# Patient Record
Sex: Female | Born: 1935 | Race: White | Hispanic: No | Marital: Married | Smoking: Never smoker
Health system: Southern US, Community
[De-identification: ages and names within clinical notes are randomized; demographics above are authoritative.]

## PROBLEM LIST (undated history)

## (undated) DIAGNOSIS — M1990 Unspecified osteoarthritis, unspecified site: Secondary | ICD-10-CM

## (undated) DIAGNOSIS — I639 Cerebral infarction, unspecified: Secondary | ICD-10-CM

## (undated) DIAGNOSIS — I2699 Other pulmonary embolism without acute cor pulmonale: Secondary | ICD-10-CM

## (undated) DIAGNOSIS — C4490 Unspecified malignant neoplasm of skin, unspecified: Secondary | ICD-10-CM

## (undated) DIAGNOSIS — M797 Fibromyalgia: Secondary | ICD-10-CM

## (undated) DIAGNOSIS — G8929 Other chronic pain: Secondary | ICD-10-CM

## (undated) DIAGNOSIS — M549 Dorsalgia, unspecified: Secondary | ICD-10-CM

## (undated) HISTORY — PX: WRIST FRACTURE SURGERY: SHX121

## (undated) HISTORY — PX: ABDOMINAL HYSTERECTOMY: SHX81

## (undated) HISTORY — PX: INSERTION OF MESH: SHX5868

## (undated) HISTORY — PX: CHOLECYSTECTOMY: SHX55

## (undated) HISTORY — PX: CATARACT EXTRACTION, BILATERAL: SHX1313

## (undated) HISTORY — PX: HAND SURGERY: SHX662

## (undated) HISTORY — PX: FOOT SURGERY: SHX648

---

## 1998-03-07 ENCOUNTER — Ambulatory Visit (HOSPITAL_COMMUNITY): Admission: RE | Admit: 1998-03-07 | Discharge: 1998-03-07 | Payer: Self-pay | Admitting: Cardiology

## 1998-07-02 ENCOUNTER — Ambulatory Visit (HOSPITAL_COMMUNITY): Admission: RE | Admit: 1998-07-02 | Discharge: 1998-07-02 | Payer: Self-pay | Admitting: *Deleted

## 1999-04-03 ENCOUNTER — Ambulatory Visit (HOSPITAL_COMMUNITY): Admission: RE | Admit: 1999-04-03 | Discharge: 1999-04-03 | Payer: Self-pay | Admitting: *Deleted

## 1999-04-03 ENCOUNTER — Encounter (INDEPENDENT_AMBULATORY_CARE_PROVIDER_SITE_OTHER): Payer: Self-pay | Admitting: Specialist

## 1999-12-08 ENCOUNTER — Ambulatory Visit (HOSPITAL_COMMUNITY): Admission: RE | Admit: 1999-12-08 | Discharge: 1999-12-08 | Payer: Self-pay | Admitting: *Deleted

## 2001-05-17 ENCOUNTER — Encounter (INDEPENDENT_AMBULATORY_CARE_PROVIDER_SITE_OTHER): Payer: Self-pay | Admitting: *Deleted

## 2001-05-17 ENCOUNTER — Ambulatory Visit (HOSPITAL_COMMUNITY): Admission: RE | Admit: 2001-05-17 | Discharge: 2001-05-17 | Payer: Self-pay | Admitting: *Deleted

## 2003-01-30 ENCOUNTER — Encounter: Payer: Self-pay | Admitting: Family Medicine

## 2003-01-30 ENCOUNTER — Encounter: Admission: RE | Admit: 2003-01-30 | Discharge: 2003-01-30 | Payer: Self-pay | Admitting: Family Medicine

## 2009-11-08 ENCOUNTER — Emergency Department (HOSPITAL_BASED_OUTPATIENT_CLINIC_OR_DEPARTMENT_OTHER): Admission: EM | Admit: 2009-11-08 | Discharge: 2009-11-08 | Payer: Self-pay | Admitting: Emergency Medicine

## 2009-11-08 ENCOUNTER — Ambulatory Visit: Payer: Self-pay | Admitting: Radiology

## 2011-02-06 NOTE — Procedures (Signed)
[place]. [facility]  Patient:    Brittney Hicks, Brittney Hicks Visit Number: [id number] MRN: [id number]          Service Type: END Location: ENDO Attending Physician:  Sabino Gasser Proc. Date: 05/17/01 Adm. Date:  05/17/2001                             Procedure Report  PROCEDURE PERFORMED:  Colonoscopy.  ENDOSCOPIST:  Sabino Gasser, M.D.  INDICATIONS FOR PROCEDURE:  Hemoccult positivity.  Colon cancer screening. Polyps by history.  ANESTHESIA:  Fentanyl an additional 40 and Versed 4 mg.  DESCRIPTION OF PROCEDURE:  With the patient mildly sedated in the left lateral decubitus position, the Olympus videoscopic pediatric colonoscope was inserted in the rectum and passed under direct vision into the cecum.  The cecum was identified by the ileocecal valve and appendiceal orifice, both of which were photographed.  From this point, the colonoscope was slowly withdrawn, taking circumferential views of the entire colonic mucosa stopping only then in the rectum which appeared normal on direct view and showed hemorrhoids on retroflex view.  The endoscope was straightened and withdrawn.  Patients vital signs and pulse oximeter remained stable.  The patient tolerated the procedure well and without apparent complications.  FINDINGS:  Internal hemorrhoids, otherwise unremarkable colonoscopic examination to the cecum.  PLAN:  See endoscopy note for further details of follow-up plan. Attending Physician:  Sabino Gasser DD:  05/17/01 TD:  05/17/01 Job: [id number]

## 2011-02-06 NOTE — Procedures (Signed)
[place]. [facility]  Patient:    Brittney Hicks, CRAGIN Visit Number: [id number] MRN: [id number]          Service Type: Attending:  Sabino Gasser, M.D. Proc. Date: 05/17/01                             Procedure Report  PROCEDURE PERFORMED:  Upper endoscopy with biopsy.  ENDOSCOPIST:  Sabino Gasser, M.D.  INDICATIONS FOR PROCEDURE:  Abdominal pain.  ANESTHESIA:  Fentanyl 90 mcg, Versed 9 mg.  DESCRIPTION OF PROCEDURE:  With the patient mildly sedated in the left lateral decubitus position, the Olympus video endoscope was inserted in the mouth and passed under direct vision through the esophagus which appeared normal into the stomach.  The fundus was viewed and appeared normal with normal folds. However, the body was markedly inflamed and eroded, ulcerated and very friable-appearing mucosa that was limited to the body.  This was photographed and multiple biopsies were taken.  We advanced to the antrum which appeared relatively normal as did the duodenal bulb and second portion of the duodenum.  From this point, the endoscope was slowly withdrawn taking circumferential views of the entire duodenal mucosa until the endoscope had been pulled back into the stomach at which point we once again photographed the body and biopsied it and placed the endoscope on retroflexion to view the stomach from below and this was photographed.  The endoscope was then straightened and withdrawn taking circumferential views of the remaining gastric mucosa and esophageal mucosa, all of which otherwise appeared normal.  Patients vital signs and pulse oximeter remained stable.  The patient tolerated the procedure well without apparent complications.  FINDINGS:  Markedly inflamed area in the body of the stomach, await biopsy report.  The patient will call for results and follow up with me as an outpatient.  Proceed with colonoscopy as planned. Attending:  Sabino Gasser, M.D. DD:  05/17/01 TD:   05/17/01 Job: [id number]

## 2014-07-31 ENCOUNTER — Emergency Department (HOSPITAL_BASED_OUTPATIENT_CLINIC_OR_DEPARTMENT_OTHER)
Admission: EM | Admit: 2014-07-31 | Discharge: 2014-07-31 | Disposition: A | Discharge status: Home / Self Care | Payer: Medicare Other | Attending: Emergency Medicine | Admitting: Emergency Medicine

## 2014-07-31 ENCOUNTER — Encounter (HOSPITAL_BASED_OUTPATIENT_CLINIC_OR_DEPARTMENT_OTHER): Payer: Self-pay | Admitting: Emergency Medicine

## 2014-07-31 ENCOUNTER — Emergency Department (HOSPITAL_BASED_OUTPATIENT_CLINIC_OR_DEPARTMENT_OTHER): Payer: Medicare Other

## 2014-07-31 DIAGNOSIS — Z86711 Personal history of pulmonary embolism: Secondary | ICD-10-CM | POA: Diagnosis not present

## 2014-07-31 DIAGNOSIS — Y9389 Activity, other specified: Secondary | ICD-10-CM | POA: Diagnosis not present

## 2014-07-31 DIAGNOSIS — N390 Urinary tract infection, site not specified: Secondary | ICD-10-CM | POA: Insufficient documentation

## 2014-07-31 DIAGNOSIS — G8929 Other chronic pain: Secondary | ICD-10-CM | POA: Insufficient documentation

## 2014-07-31 DIAGNOSIS — S3992XA Unspecified injury of lower back, initial encounter: Secondary | ICD-10-CM | POA: Diagnosis present

## 2014-07-31 DIAGNOSIS — Y990 Civilian activity done for income or pay: Secondary | ICD-10-CM | POA: Insufficient documentation

## 2014-07-31 DIAGNOSIS — S32030A Wedge compression fracture of third lumbar vertebra, initial encounter for closed fracture: Secondary | ICD-10-CM | POA: Insufficient documentation

## 2014-07-31 DIAGNOSIS — Y92009 Unspecified place in unspecified non-institutional (private) residence as the place of occurrence of the external cause: Secondary | ICD-10-CM | POA: Insufficient documentation

## 2014-07-31 DIAGNOSIS — Z8739 Personal history of other diseases of the musculoskeletal system and connective tissue: Secondary | ICD-10-CM | POA: Diagnosis not present

## 2014-07-31 DIAGNOSIS — W01198A Fall on same level from slipping, tripping and stumbling with subsequent striking against other object, initial encounter: Secondary | ICD-10-CM | POA: Insufficient documentation

## 2014-07-31 DIAGNOSIS — W19XXXA Unspecified fall, initial encounter: Secondary | ICD-10-CM

## 2014-07-31 HISTORY — DX: Other pulmonary embolism without acute cor pulmonale: I26.99

## 2014-07-31 HISTORY — DX: Unspecified osteoarthritis, unspecified site: M19.90

## 2014-07-31 HISTORY — DX: Dorsalgia, unspecified: M54.9

## 2014-07-31 HISTORY — DX: Other chronic pain: G89.29

## 2014-07-31 LAB — URINE MICROSCOPIC-ADD ON

## 2014-07-31 LAB — URINALYSIS, ROUTINE W REFLEX MICROSCOPIC
Bilirubin Urine: NEGATIVE
Glucose, UA: NEGATIVE mg/dL
Hgb urine dipstick: NEGATIVE
Ketones, ur: NEGATIVE mg/dL
NITRITE: POSITIVE — AB
Protein, ur: NEGATIVE mg/dL
SPECIFIC GRAVITY, URINE: 1.018 (ref 1.005–1.030)
Urobilinogen, UA: 0.2 mg/dL (ref 0.0–1.0)
pH: 5.5 (ref 5.0–8.0)

## 2014-07-31 MED ORDER — DEXTROSE 5 % IV SOLN: 1.0000 g | Freq: Once | INTRAVENOUS | Status: DC

## 2014-07-31 MED ORDER — CEPHALEXIN 500 MG PO CAPS: 500.0000 mg | ORAL_CAPSULE | Freq: Four times a day (QID) | ORAL | Status: AC

## 2014-07-31 MED ORDER — HYDROCODONE-ACETAMINOPHEN 5-325 MG PO TABS
2.0000 | ORAL_TABLET | Freq: Once | ORAL | Status: DC
  Filled 2014-07-31: qty 2

## 2014-07-31 MED ORDER — CEFTRIAXONE SODIUM 1 G IJ SOLR
1.0000 g | Freq: Once | INTRAMUSCULAR | Status: AC
  Administered 2014-07-31: 1 g via INTRAMUSCULAR
  Filled 2014-07-31: qty 10

## 2014-07-31 MED ORDER — HYDROCODONE-ACETAMINOPHEN 5-325 MG PO TABS
2.0000 | ORAL_TABLET | Freq: Once | ORAL | Status: AC
  Administered 2014-07-31: 2 via ORAL

## 2014-07-31 MED ORDER — LIDOCAINE HCL (PF) 1 % IJ SOLN
INTRAMUSCULAR | Status: AC
  Administered 2014-07-31: 2 mL
  Filled 2014-07-31: qty 5

## 2014-07-31 NOTE — ED Notes (Signed)
Patient transported to X-ray 

## 2014-07-31 NOTE — ED Provider Notes (Signed)
CSN: [id number]     Arrival date & time 07/31/14  1038 History   First MD Initiated Contact with Patient 07/31/14 1049     Chief Complaint  Patient presents with  . Fall     (Consider location/radiation/quality/duration/timing/severity/associated sxs/prior Treatment) Patient is a 80 y.o. female presenting with fall. The history is provided by the patient.  Fall This is a new problem. The current episode started 1 to 2 hours ago. Episode frequency: once. The problem has been resolved. Pertinent negatives include no chest pain, no abdominal pain, no headaches and no shortness of breath. Exacerbated by: standing. Nothing relieves the symptoms. She has tried nothing for the symptoms. The treatment provided no relief.    Past Medical History  Diagnosis Date  . Chronic pain   . Osteoarthritis   . Chronic back pain greater than 3 months duration   . PE (pulmonary embolism)    No past surgical history on file. No family history on file. History  Substance Use Topics  . Smoking status: Never Smoker   . Smokeless tobacco: Not on file  . Alcohol Use: Not on file   OB History    No data available     Review of Systems  Constitutional: Negative for fever and fatigue.  HENT: Negative for congestion and drooling.   Eyes: Negative for pain.  Respiratory: Negative for cough and shortness of breath.   Cardiovascular: Negative for chest pain.  Gastrointestinal: Negative for nausea, vomiting, abdominal pain and diarrhea.  Genitourinary: Negative for dysuria and hematuria.  Musculoskeletal: Negative for back pain, gait problem and neck pain.  Skin: Negative for color change.  Neurological: Negative for dizziness and headaches.  Hematological: Negative for adenopathy.  Psychiatric/Behavioral: Negative for behavioral problems.  All other systems reviewed and are negative.     Allergies  Lovenox; Stadol; and Vistaril  Home Medications   Prior to Admission medications   Not on File    BP 123/87 mmHg  Pulse 90  Temp(Src) 98.1 F (36.7 C)  Resp 18  SpO2 97% Physical Exam  Constitutional: She is oriented to person, place, and time. She appears well-developed and well-nourished.  HENT:  Head: Normocephalic and atraumatic.  Mouth/Throat: Oropharynx is clear and moist. No oropharyngeal exudate.  No obvious trauma to the scalp.  Eyes: Conjunctivae and EOM are normal. Pupils are equal, round, and reactive to light.  Neck: Normal range of motion. Neck supple.  Cardiovascular: Normal rate, regular rhythm, normal heart sounds and intact distal pulses.  Exam reveals no gallop and no friction rub.   No murmur heard. Pulmonary/Chest: Effort normal and breath sounds normal. No respiratory distress. She has no wheezes.  Abdominal: Soft. Bowel sounds are normal. There is no tenderness. There is no rebound and no guarding.  Musculoskeletal: Normal range of motion. She exhibits no edema or tenderness.  Old appearing superficial burn to the dorsum of the right hand at the base of the thumb.  Mild abrasion to the base of the left palm. Mild tenderness to palpation in this area. Normal range of motion of the left wrist.  Mild nonspecific tenderness to the lower lumbar spine. No other vertebral tenderness noted.  Neurological: She is alert and oriented to person, place, and time.  alert, oriented x3 speech: normal in context and clarity memory: intact grossly cranial nerves II-XII: intact motor strength: full proximally and distally no involuntary movements or tremors sensation: intact to light touch diffusely  cerebellar: finger-to-nose  gait: normal gait  Skin: Skin  is warm and dry.  Psychiatric: She has a normal mood and affect. Her behavior is normal.  Nursing note and vitals reviewed.   ED Course  Procedures (including critical care time) Labs Review Labs Reviewed  URINALYSIS, ROUTINE W REFLEX MICROSCOPIC - Abnormal; Notable for the following:    Nitrite POSITIVE (*)     Leukocytes, UA TRACE (*)    All other components within normal limits  URINE MICROSCOPIC-ADD ON - Abnormal; Notable for the following:    Squamous Epithelial / LPF FEW (*)    Bacteria, UA MANY (*)    All other components within normal limits  URINE CULTURE    Imaging Review Dg Lumbar Spine Complete  07/31/2014   CLINICAL DATA:  Status post fall this morning now with low back pain  EXAM: LUMBAR SPINE - COMPLETE 4+ VIEW  COMPARISON:  Coronal and sagittal reconstructed images from a CT scan of the abdomen and pelvis of October 10, 2011  FINDINGS: The lumbar vertebral bodies are diffusely osteopenic. There is mild compression of the body of L1 with loss of height of 10% or left anteriorly. This is new since 2013. There is mild narrowing of the intervertebral disc spaces especially at L4-5 and L5-S1. There is facet joint hypertrophy at the same 2 levels. There is no spondylolisthesis. The pedicles and transverse processes are intact where visualized. The observed portions of the sacrum are unremarkable. There is an inferior vena caval filter present to the right of midline at L3. There are surgical clips in the gallbladder fossa.  IMPRESSION: There is very mild partial compression of the body of L3 new since 2013. There are degenerative disc and facet joint changes in the lower lumbar spine.   Electronically Signed   By: David  Martinique   On: 07/31/2014 12:40   Dg Wrist Complete Left  07/31/2014   CLINICAL DATA:  Status post fall this morning now with left hand and wrist pain  EXAM: LEFT WRIST - COMPLETE 3+ VIEW  COMPARISON:  None.  FINDINGS: The bones of the wrist are osteopenic. There are degenerative intercarpal joint changes. There is mild narrowing of the radiocarpal joint. The metallic hardware in the distal radius from previous fracture is intact. The soft tissues are unremarkable.  IMPRESSION: There is no acute bony abnormality of the left wrist. There is moderate degenerative change.    Electronically Signed   By: David  Martinique   On: 07/31/2014 12:37   Dg Hand Complete Left  07/31/2014   CLINICAL DATA:  Status post fall this morning with persistent left hand and wrist pain  EXAM: LEFT HAND - COMPLETE 3+ VIEW  COMPARISON:  Left thumb series of November 12, 2008  FINDINGS: The bones of the hand are osteopenic. The interphalangeal and metacarpophalangeal joints are mildly narrowed not out of proportion to the patient's age. No acute phalangeal or metacarpal fracture is demonstrated. The soft tissues are unremarkable. There are mild degenerative changes of the intercarpal joints. The patient has undergone previous ORIF for a previous radial fracture.  IMPRESSION: There is no acute bony abnormality of the left hand or wrist. If the patient has symptoms referable to the distal forearm, plain films of the radius and ulna would be useful.   Electronically Signed   By: David  Martinique   On: 07/31/2014 12:36     EKG Interpretation   Date/Time:  Tuesday July 31 2014 11:11:01 EST Ventricular Rate:  77 PR Interval:  166 QRS Duration: 76 QT Interval:  382  QTC Calculation: 432 R Axis:   25 Text Interpretation:  Normal sinus rhythm Confirmed by Lorriane Dehart  MD,  Derren Suydam ([id number]) on 07/31/2014 1:24:41 PM      MDM   Final diagnoses:  Fall  Compression fracture of L3 lumbar vertebra, closed, initial encounter  UTI (lower urinary tract infection)    11:02 AM 80 y.o. female the history of PE not on blood thinners who presents with a mechanical fall which occurred prior to arrival. The family notes that the patient usually has some staggering when she gets up from a seated position. The patient endorses that this is what happened today and she fell backwards onto a hard floor hitting her buttocks. She then fell back hitting her head against the wall. She denies any loss of consciousness. She denies any headache. She currently complains of low back pain and left hand pain. We'll give her pain  control and get screening imaging.  UA c/w UTI. Will give 1g Rocephin here, home on keflex. Lumbar spine shows mild partial comp fx of L3 new since 2013. Pain controlled, pt is ambulatory w/out difficulty. Plan for d/c home.   1:56 PM:  I have discussed the diagnosis/risks/treatment options with the patient and family and believe the pt to be eligible for discharge home to follow-up with her pcp. The pt has pain medicine at home. We also discussed returning to the ED immediately if new or worsening sx occur. We discussed the sx which are most concerning (e.g., worsening pain, fever, further falls) that necessitate immediate return. Medications administered to the patient during their visit and any new prescriptions provided to the patient are listed below.  Medications given during this visit Medications  HYDROcodone-acetaminophen (NORCO/VICODIN) 5-325 MG per tablet 2 tablet (2 tablets Oral Given 07/31/14 1105)  cefTRIAXone (ROCEPHIN) injection 1 g (1 g Intramuscular Given 07/31/14 1354)  lidocaine (PF) (XYLOCAINE) 1 % injection (2 mLs  Given 07/31/14 1354)    Discharge Medication List as of 07/31/2014  1:58 PM    START taking these medications   Details  cephALEXin (KEFLEX) 500 MG capsule Take 1 capsule (500 mg total) by mouth 4 (four) times daily., Starting 07/31/2014, Until Tue 08/07/14, Print         Pamella Pert, MD 07/31/14 2128

## 2014-07-31 NOTE — ED Notes (Signed)
MD at bedside. 

## 2014-07-31 NOTE — ED Notes (Signed)
Per EMS:  Pt at home, pt has balance issues.  Pt refuses to use cane.   Pt stumbled and fell onto buttocks and back.    No spinal cord pain.  Pt having pain to right wrist.  Good CMS.

## 2014-08-02 LAB — URINE CULTURE: SPECIAL REQUESTS: NORMAL

## 2014-08-03 ENCOUNTER — Telehealth (HOSPITAL_BASED_OUTPATIENT_CLINIC_OR_DEPARTMENT_OTHER): Payer: Self-pay | Admitting: Emergency Medicine

## 2014-08-03 NOTE — Telephone Encounter (Signed)
Post ED Visit - Positive Culture Follow-up  Culture report reviewed by antimicrobial stewardship pharmacist: []  Wes Woodford, Pharm.D., BCPS [x]  Heide Guile, Pharm.D., BCPS []  Alycia Rossetti, Pharm.D., BCPS []  Crystal, [place].D., BCPS, AAHIVP []  Legrand Como, Pharm.D., BCPS, AAHIVP []  Elicia Lamp, Pharm.D.   Positive urine culture Treated with cephalexin, organism sensitive to the same and no further patient follow-up is required at this time.  Hazle Nordmann 08/03/2014, 4:44 PM

## 2017-04-12 ENCOUNTER — Emergency Department (HOSPITAL_BASED_OUTPATIENT_CLINIC_OR_DEPARTMENT_OTHER): Payer: Medicare Other

## 2017-04-12 ENCOUNTER — Emergency Department (HOSPITAL_BASED_OUTPATIENT_CLINIC_OR_DEPARTMENT_OTHER)
Admission: EM | Admit: 2017-04-12 | Discharge: 2017-04-12 | Disposition: A | Discharge status: Home / Self Care | Payer: Medicare Other | Attending: Emergency Medicine | Admitting: Emergency Medicine

## 2017-04-12 ENCOUNTER — Encounter (HOSPITAL_BASED_OUTPATIENT_CLINIC_OR_DEPARTMENT_OTHER): Payer: Self-pay | Admitting: *Deleted

## 2017-04-12 DIAGNOSIS — Y939 Activity, unspecified: Secondary | ICD-10-CM | POA: Insufficient documentation

## 2017-04-12 DIAGNOSIS — M5410 Radiculopathy, site unspecified: Secondary | ICD-10-CM

## 2017-04-12 DIAGNOSIS — M5414 Radiculopathy, thoracic region: Secondary | ICD-10-CM | POA: Insufficient documentation

## 2017-04-12 DIAGNOSIS — W1789XA Other fall from one level to another, initial encounter: Secondary | ICD-10-CM | POA: Diagnosis not present

## 2017-04-12 DIAGNOSIS — M7062 Trochanteric bursitis, left hip: Secondary | ICD-10-CM | POA: Insufficient documentation

## 2017-04-12 DIAGNOSIS — Z85828 Personal history of other malignant neoplasm of skin: Secondary | ICD-10-CM | POA: Diagnosis not present

## 2017-04-12 DIAGNOSIS — M545 Low back pain: Secondary | ICD-10-CM | POA: Diagnosis present

## 2017-04-12 DIAGNOSIS — Y999 Unspecified external cause status: Secondary | ICD-10-CM | POA: Diagnosis not present

## 2017-04-12 DIAGNOSIS — Z79899 Other long term (current) drug therapy: Secondary | ICD-10-CM | POA: Diagnosis not present

## 2017-04-12 HISTORY — DX: Cerebral infarction, unspecified: I63.9

## 2017-04-12 HISTORY — DX: Fibromyalgia: M79.7

## 2017-04-12 HISTORY — DX: Unspecified malignant neoplasm of skin, unspecified: C44.90

## 2017-04-12 NOTE — ED Triage Notes (Signed)
Pt reports falling on Saturday. She was standing by a chair that had a lot of heavy fabric draped across the back and it slid off and she fell with it. C/o pain in left lower back and hip. Had a cortisone injection in left hip earlier this month for bursitis. Pt reports difficulty getting up and down but is able to ambulate using her home walker. Denies LOC. Also reports bruise to left arm

## 2017-04-12 NOTE — ED Provider Notes (Signed)
[place] DEPT [facility] Provider Note   CSN: [id number] Arrival date & time: 04/12/17  0820     History   Chief Complaint Chief Complaint  Patient presents with  . Back Pain    HPI Brittney Hicks is a 83 y.o. female.  HPI   83 year old female presents with concern for left lower buttock pain and pain to the lateral hip. She had similar pain earlier this month, received an injection for trochanteric bursitis and had improvement. She then had a fall on Saturday, where she leaned on a chair with a blanket which filled and she fell onto her left side on the wood floor. Denies hitting her head, loss of consciousness, neck pain, numbness, weakness, recent infection symptoms. Reports she's been in her normal state of health. Reports that she has taken hydrocodone for prior back and hip pain, however has not taken it since this injury. Patient denies use of blood thinners. Reports pain is worse when she first goes to get up, however is improved when she continues to walk with her walker.  Past Medical History:  Diagnosis Date  . Chronic back pain greater than 3 months duration   . Chronic pain   . Fibromyalgia   . Osteoarthritis   . PE (pulmonary embolism)   . Skin cancer   . Stroke [facility])     There are no active problems to display for this patient.   Past Surgical History:  Procedure Laterality Date  . ABDOMINAL HYSTERECTOMY    . CATARACT EXTRACTION, BILATERAL    . CHOLECYSTECTOMY    . FOOT SURGERY    . HAND SURGERY    . INSERTION OF MESH    . WRIST FRACTURE SURGERY      OB History    No data available       Home Medications    Prior to Admission medications   Medication Sig Start Date End Date Taking? Authorizing Provider  cetirizine (ZYRTEC) 10 MG tablet Take 10 mg by mouth daily.   Yes [provider]  clonazePAM (KLONOPIN) 1 MG tablet Take 1.5 mg by mouth daily.    Yes [provider]  Cyanocobalamin (VITAMIN B-12 IJ) Inject 1,000 mcg as  directed every 30 (thirty) days.   Yes [provider]  dexlansoprazole (DEXILANT) 60 MG capsule Take 60 mg by mouth daily.   Yes [provider]  HYDROcodone-acetaminophen (NORCO) 7.5-325 MG tablet Take 1 tablet by mouth every 6 (six) hours as needed for moderate pain.   Yes [provider]  metoprolol tartrate (LOPRESSOR) 25 MG tablet Take 12.5 mg by mouth 2 (two) times daily.   Yes [provider]  mirtazapine (REMERON) 15 MG tablet Take 45 mg by mouth at bedtime.    Yes [provider]  ranitidine (ZANTAC) 150 MG tablet Take 150 mg by mouth daily.   Yes [provider]  rOPINIRole (REQUIP) 2 MG tablet Take 7 mg by mouth at bedtime.    Yes [provider]  temazepam (RESTORIL) 15 MG capsule Take 15 mg by mouth at bedtime as needed for sleep.   Yes [provider]  trimethoprim (TRIMPEX) 100 MG tablet Take 100 mg by mouth 2 (two) times daily.   Yes [provider]  valACYclovir (VALTREX) 1000 MG tablet Take 1,000 mg by mouth every other day.   Yes [provider]  sertraline (ZOLOFT) 100 MG tablet Take 100 mg by mouth daily.    [provider]  Family History No family history on file.  Social History Social History  Substance Use Topics  . Smoking status: Never Smoker  . Smokeless tobacco: Never Used  . Alcohol use No     Allergies   Benzocaine; Lovenox [enoxaparin sodium]; Stadol [butorphanol]; Tape; Vistaril [hydroxyzine hcl]; Warfarin and related; Demerol [meperidine]; Dilaudid [hydromorphone hcl]; and Macrobid [nitrofurantoin]   Review of Systems Review of Systems  Constitutional: Negative for fever.  HENT: Negative for sore throat.   Eyes: Negative for visual disturbance.  Respiratory: Negative for cough and shortness of breath.   Cardiovascular: Negative for chest pain.  Gastrointestinal: Negative for abdominal pain.  Genitourinary: Negative for difficulty urinating.    Musculoskeletal: Positive for arthralgias and back pain. Negative for neck pain.  Skin: Negative for rash.  Neurological: Negative for syncope, weakness, numbness and headaches.     Physical Exam Updated Vital Signs BP 124/73 (BP Location: Right Arm)   Pulse 82   Temp 98 F (36.7 C) (Oral)   Resp 20   SpO2 97%   Physical Exam  Constitutional: She is oriented to person, place, and time. She appears well-developed and well-nourished. No distress.  HENT:  Head: Normocephalic and atraumatic.  Eyes: Conjunctivae and EOM are normal.  Neck: Normal range of motion.  Cardiovascular: Normal rate, regular rhythm, normal heart sounds and intact distal pulses.  Exam reveals no gallop and no friction rub.   No murmur heard. Pulmonary/Chest: Effort normal and breath sounds normal. No respiratory distress. She has no wheezes. She has no rales.  Abdominal: Soft. She exhibits no distension. There is no tenderness. There is no guarding.  Musculoskeletal: She exhibits no edema or tenderness.  Tenderness over sciatic notch, left trochanteric bursa Full ROM of hip, knee, ankle Tenderness left paraspinal muscles lumbar area No cervical, thoracic, or midline lumbar tenderness  Neurological: She is alert and oriented to person, place, and time. She has normal strength. No sensory deficit.  Skin: Skin is warm and dry. No rash noted. She is not diaphoretic. No erythema.  Nursing note and vitals reviewed.    ED Treatments / Results  Labs (all labs ordered are listed, but only abnormal results are displayed) Labs Reviewed - No data to display  EKG  EKG Interpretation None       Radiology Dg Lumbar Spine Complete  Result Date: 04/12/2017 CLINICAL DATA:  [place] 2 days ago with low back pain. EXAM: LUMBAR SPINE - COMPLETE 4+ VIEW COMPARISON:  01/29/2017 FINDINGS: Bones are diffusely demineralized. Surgical clips right upper quadrant suggest prior cholecystectomy. IVC filter visualized in situ.  Multiple old right rib fractures are evident. Compression fracture at L1 is stable. Loss of intervertebral disc height at L4-5 and L5-S1, unchanged. Bones are diffusely demineralized. IMPRESSION: 1. Stable exam. L1 compression deformity unchanged in the interval. No acute lumbar spine fracture evident. 2. Old posterior right rib fractures. 3. Lower lumbar degenerative changes. Electronically Signed   By: Misty Stanley M.D.   On: 04/12/2017 09:07   Dg Hip Unilat W Or Wo Pelvis 2-3 Views Left  Result Date: 04/12/2017 CLINICAL DATA:  Left hip and low back pain since falling 2 days ago. EXAM: DG HIP (WITH OR WITHOUT PELVIS) 2-3V LEFT COMPARISON:  Pelvic CT 10/27/2014. FINDINGS: The bones appear mildly demineralized. There is no evidence of acute fracture or dislocation. There is no evidence of femoral head avascular necrosis. The hip and sacroiliac joint spaces are maintained. There is lower lumbar spondylosis, an IVC filter and a surgical clip  overlying the left iliac bone. IMPRESSION: No evidence of acute fracture or dislocation. If the patient has persistent hip pain or inability to bear weight, follow up imaging may be warranted as acute hip fractures can be radiographically occult in the elderly. Electronically Signed   By: Richardean Sale M.D.   On: 04/12/2017 09:09    Procedures Procedures (including critical care time)  Medications Ordered in ED Medications - No data to display   Initial Impression / Assessment and Plan / ED Course  I have reviewed the triage vital signs and the nursing notes.  Pertinent labs & imaging results that were available during my care of the patient were reviewed by me and considered in my medical decision making (see chart for details).     83 year old female presents with concern for fall with left hip and lower back pain. X-rays show no sign of acute fracture. Patient is able to bear weight and ambulate without significant pain, and have illicit suspicion for  occult fracture. No lumbar midline pain. She is neurovascularly intact. Denies any other injuries from fall or recent illness. Patient had history of similar symptoms prior, and feel file probably exacerbated her trochanteric bursitis as well as radicular back pain. Recommended continued supportive care and follow-up with her orthopedic physician.  Final Clinical Impressions(s) / ED Diagnoses   Final diagnoses:  Trochanteric bursitis of left hip  Radicular low back pain    New Prescriptions New Prescriptions   No medications on file     Gareth Morgan, MD 04/12/17 [phone]

## 2019-10-08 ENCOUNTER — Encounter (HOSPITAL_COMMUNITY): Payer: Self-pay | Admitting: Family Medicine

## 2019-10-08 ENCOUNTER — Emergency Department (HOSPITAL_COMMUNITY): Payer: Medicare PPO

## 2019-10-08 ENCOUNTER — Other Ambulatory Visit: Payer: Self-pay

## 2019-10-08 ENCOUNTER — Inpatient Hospital Stay (HOSPITAL_COMMUNITY)
Admission: EM | Admit: 2019-10-08 | Discharge: 2019-10-14 | DRG: 178 | Disposition: A | Discharge status: Home / Self Care | Payer: Medicare PPO | Attending: Internal Medicine | Admitting: Internal Medicine

## 2019-10-08 ENCOUNTER — Observation Stay (HOSPITAL_BASED_OUTPATIENT_CLINIC_OR_DEPARTMENT_OTHER): Payer: Medicare PPO

## 2019-10-08 DIAGNOSIS — U071 COVID-19: Secondary | ICD-10-CM

## 2019-10-08 DIAGNOSIS — K2100 Gastro-esophageal reflux disease with esophagitis, without bleeding: Secondary | ICD-10-CM | POA: Diagnosis present

## 2019-10-08 DIAGNOSIS — Z79899 Other long term (current) drug therapy: Secondary | ICD-10-CM

## 2019-10-08 DIAGNOSIS — K449 Diaphragmatic hernia without obstruction or gangrene: Secondary | ICD-10-CM | POA: Diagnosis present

## 2019-10-08 DIAGNOSIS — I10 Essential (primary) hypertension: Secondary | ICD-10-CM | POA: Diagnosis present

## 2019-10-08 DIAGNOSIS — R7989 Other specified abnormal findings of blood chemistry: Secondary | ICD-10-CM

## 2019-10-08 DIAGNOSIS — Z9049 Acquired absence of other specified parts of digestive tract: Secondary | ICD-10-CM

## 2019-10-08 DIAGNOSIS — F419 Anxiety disorder, unspecified: Secondary | ICD-10-CM

## 2019-10-08 DIAGNOSIS — N179 Acute kidney failure, unspecified: Secondary | ICD-10-CM | POA: Diagnosis present

## 2019-10-08 DIAGNOSIS — F329 Major depressive disorder, single episode, unspecified: Secondary | ICD-10-CM | POA: Diagnosis present

## 2019-10-08 DIAGNOSIS — Z66 Do not resuscitate: Secondary | ICD-10-CM | POA: Diagnosis present

## 2019-10-08 DIAGNOSIS — G8929 Other chronic pain: Secondary | ICD-10-CM | POA: Diagnosis present

## 2019-10-08 DIAGNOSIS — K5900 Constipation, unspecified: Secondary | ICD-10-CM | POA: Diagnosis present

## 2019-10-08 DIAGNOSIS — N183 Chronic kidney disease, stage 3 unspecified: Secondary | ICD-10-CM | POA: Diagnosis present

## 2019-10-08 DIAGNOSIS — Z885 Allergy status to narcotic agent status: Secondary | ICD-10-CM

## 2019-10-08 DIAGNOSIS — R531 Weakness: Secondary | ICD-10-CM

## 2019-10-08 DIAGNOSIS — Z8673 Personal history of transient ischemic attack (TIA), and cerebral infarction without residual deficits: Secondary | ICD-10-CM

## 2019-10-08 DIAGNOSIS — D539 Nutritional anemia, unspecified: Secondary | ICD-10-CM | POA: Diagnosis present

## 2019-10-08 DIAGNOSIS — Z95828 Presence of other vascular implants and grafts: Secondary | ICD-10-CM

## 2019-10-08 DIAGNOSIS — I129 Hypertensive chronic kidney disease with stage 1 through stage 4 chronic kidney disease, or unspecified chronic kidney disease: Secondary | ICD-10-CM | POA: Diagnosis present

## 2019-10-08 DIAGNOSIS — N1832 Chronic kidney disease, stage 3b: Secondary | ICD-10-CM | POA: Diagnosis not present

## 2019-10-08 DIAGNOSIS — Z85828 Personal history of other malignant neoplasm of skin: Secondary | ICD-10-CM

## 2019-10-08 DIAGNOSIS — Z888 Allergy status to other drugs, medicaments and biological substances status: Secondary | ICD-10-CM

## 2019-10-08 DIAGNOSIS — Z90710 Acquired absence of both cervix and uterus: Secondary | ICD-10-CM

## 2019-10-08 DIAGNOSIS — E876 Hypokalemia: Secondary | ICD-10-CM | POA: Diagnosis not present

## 2019-10-08 DIAGNOSIS — E860 Dehydration: Secondary | ICD-10-CM

## 2019-10-08 DIAGNOSIS — N390 Urinary tract infection, site not specified: Secondary | ICD-10-CM

## 2019-10-08 DIAGNOSIS — Z86711 Personal history of pulmonary embolism: Secondary | ICD-10-CM

## 2019-10-08 DIAGNOSIS — Z87440 Personal history of urinary (tract) infections: Secondary | ICD-10-CM

## 2019-10-08 DIAGNOSIS — R5383 Other fatigue: Secondary | ICD-10-CM | POA: Diagnosis present

## 2019-10-08 DIAGNOSIS — M797 Fibromyalgia: Secondary | ICD-10-CM | POA: Diagnosis present

## 2019-10-08 LAB — CBC WITH DIFFERENTIAL/PLATELET
Abs Immature Granulocytes: 0.02 10*3/uL (ref 0.00–0.07)
Basophils Absolute: 0 10*3/uL (ref 0.0–0.1)
Basophils Relative: 0 %
Eosinophils Absolute: 0 10*3/uL (ref 0.0–0.5)
Eosinophils Relative: 0 %
HCT: 32.2 % — ABNORMAL LOW (ref 36.0–46.0)
Hemoglobin: 10.5 g/dL — ABNORMAL LOW (ref 12.0–15.0)
Immature Granulocytes: 1 %
Lymphocytes Relative: 23 %
Lymphs Abs: 0.9 10*3/uL (ref 0.7–4.0)
MCH: 34.4 pg — ABNORMAL HIGH (ref 26.0–34.0)
MCHC: 32.6 g/dL (ref 30.0–36.0)
MCV: 105.6 fL — ABNORMAL HIGH (ref 80.0–100.0)
Monocytes Absolute: 0.3 10*3/uL (ref 0.1–1.0)
Monocytes Relative: 7 %
Neutro Abs: 2.6 10*3/uL (ref 1.7–7.7)
Neutrophils Relative %: 69 %
Platelets: 180 10*3/uL (ref 150–400)
RBC: 3.05 MIL/uL — ABNORMAL LOW (ref 3.87–5.11)
RDW: 12.6 % (ref 11.5–15.5)
WBC: 3.7 10*3/uL — ABNORMAL LOW (ref 4.0–10.5)
nRBC: 0 % (ref 0.0–0.2)

## 2019-10-08 LAB — URINALYSIS, ROUTINE W REFLEX MICROSCOPIC
Bacteria, UA: NONE SEEN
Bilirubin Urine: NEGATIVE
Glucose, UA: NEGATIVE mg/dL
Hgb urine dipstick: NEGATIVE
Ketones, ur: NEGATIVE mg/dL
Leukocytes,Ua: NEGATIVE
Nitrite: NEGATIVE
Protein, ur: 30 mg/dL — AB
Specific Gravity, Urine: 1.016 (ref 1.005–1.030)
pH: 5 (ref 5.0–8.0)

## 2019-10-08 LAB — LACTATE DEHYDROGENASE: LDH: 205 U/L — ABNORMAL HIGH (ref 98–192)

## 2019-10-08 LAB — CK: Total CK: 184 U/L (ref 38–234)

## 2019-10-08 LAB — COMPREHENSIVE METABOLIC PANEL
ALT: 19 U/L (ref 0–44)
AST: 24 U/L (ref 15–41)
Albumin: 3.3 g/dL — ABNORMAL LOW (ref 3.5–5.0)
Alkaline Phosphatase: 44 U/L (ref 38–126)
Anion gap: 11 (ref 5–15)
BUN: 35 mg/dL — ABNORMAL HIGH (ref 8–23)
CO2: 19 mmol/L — ABNORMAL LOW (ref 22–32)
Calcium: 8.3 mg/dL — ABNORMAL LOW (ref 8.9–10.3)
Chloride: 108 mmol/L (ref 98–111)
Creatinine, Ser: 1.6 mg/dL — ABNORMAL HIGH (ref 0.44–1.00)
GFR calc Af Amer: 33 mL/min — ABNORMAL LOW (ref >60)
GFR calc non Af Amer: 28 mL/min — ABNORMAL LOW (ref >60)
Glucose, Bld: 119 mg/dL — ABNORMAL HIGH (ref 70–99)
Potassium: 3.8 mmol/L (ref 3.5–5.1)
Sodium: 138 mmol/L (ref 135–145)
Total Bilirubin: 0.3 mg/dL (ref 0.3–1.2)
Total Protein: 6.5 g/dL (ref 6.5–8.1)

## 2019-10-08 LAB — TSH: TSH: 0.595 u[IU]/mL (ref 0.350–4.500)

## 2019-10-08 LAB — ABO/RH: ABO/RH(D): O POS

## 2019-10-08 LAB — FIBRINOGEN: Fibrinogen: 597 mg/dL — ABNORMAL HIGH (ref 210–475)

## 2019-10-08 LAB — C-REACTIVE PROTEIN: CRP: 10.6 mg/dL — ABNORMAL HIGH (ref <1.0)

## 2019-10-08 LAB — PROCALCITONIN: Procalcitonin: 0.11 ng/mL

## 2019-10-08 LAB — LACTIC ACID, PLASMA: Lactic Acid, Venous: 0.6 mmol/L (ref 0.5–1.9)

## 2019-10-08 LAB — FERRITIN: Ferritin: 298 ng/mL (ref 11–307)

## 2019-10-08 LAB — SARS CORONAVIRUS 2 (TAT 6-24 HRS): SARS Coronavirus 2: POSITIVE — AB

## 2019-10-08 LAB — VITAMIN B12: Vitamin B-12: 1146 pg/mL — ABNORMAL HIGH (ref 180–914)

## 2019-10-08 LAB — D-DIMER, QUANTITATIVE: D-Dimer, Quant: 2.14 ug/mL-FEU — ABNORMAL HIGH (ref 0.00–0.50)

## 2019-10-08 LAB — TRIGLYCERIDES: Triglycerides: 268 mg/dL — ABNORMAL HIGH (ref <150)

## 2019-10-08 MED ORDER — CEPHALEXIN 500 MG PO CAPS
500.0000 mg | ORAL_CAPSULE | Freq: Two times a day (BID) | ORAL | Status: DC
  Administered 2019-10-08 – 2019-10-11 (×7): 500 mg via ORAL
  Filled 2019-10-08 (×7): qty 1

## 2019-10-08 MED ORDER — SODIUM CHLORIDE 0.9% FLUSH
3.0000 mL | Freq: Two times a day (BID) | INTRAVENOUS | Status: DC
  Administered 2019-10-08 – 2019-10-14 (×10): 3 mL via INTRAVENOUS

## 2019-10-08 MED ORDER — PANTOPRAZOLE SODIUM 40 MG PO TBEC
40.0000 mg | DELAYED_RELEASE_TABLET | Freq: Two times a day (BID) | ORAL | Status: DC
  Administered 2019-10-08 – 2019-10-11 (×8): 40 mg via ORAL
  Filled 2019-10-08 (×9): qty 1

## 2019-10-08 MED ORDER — ONDANSETRON HCL 4 MG PO TABS
4.0000 mg | ORAL_TABLET | Freq: Four times a day (QID) | ORAL | Status: DC | PRN
  Administered 2019-10-10: 4 mg via ORAL
  Filled 2019-10-08: qty 1

## 2019-10-08 MED ORDER — ACETAMINOPHEN 325 MG PO TABS
650.0000 mg | ORAL_TABLET | Freq: Four times a day (QID) | ORAL | Status: DC | PRN
  Administered 2019-10-08 – 2019-10-14 (×8): 650 mg via ORAL
  Filled 2019-10-08 (×8): qty 2

## 2019-10-08 MED ORDER — SENNOSIDES-DOCUSATE SODIUM 8.6-50 MG PO TABS: 1.0000 | ORAL_TABLET | Freq: Every evening | ORAL | Status: DC | PRN

## 2019-10-08 MED ORDER — ORAL CARE MOUTH RINSE
15.0000 mL | Freq: Two times a day (BID) | OROMUCOSAL | Status: DC
  Administered 2019-10-09 – 2019-10-13 (×9): 15 mL via OROMUCOSAL

## 2019-10-08 MED ORDER — ONDANSETRON HCL 4 MG/2ML IJ SOLN
4.0000 mg | Freq: Four times a day (QID) | INTRAMUSCULAR | Status: DC | PRN
  Administered 2019-10-09: 19:00:00 4 mg via INTRAVENOUS
  Filled 2019-10-08: qty 2

## 2019-10-08 MED ORDER — SODIUM CHLORIDE 0.9 % IV BOLUS
500.0000 mL | Freq: Once | INTRAVENOUS | Status: AC
  Administered 2019-10-08: 01:00:00 500 mL via INTRAVENOUS

## 2019-10-08 MED ORDER — GUAIFENESIN-DM 100-10 MG/5ML PO SYRP
10.0000 mL | ORAL_SOLUTION | ORAL | Status: DC | PRN
  Administered 2019-10-08 – 2019-10-10 (×4): 10 mL via ORAL
  Filled 2019-10-08 (×4): qty 10

## 2019-10-08 MED ORDER — HEPARIN SODIUM (PORCINE) 5000 UNIT/ML IJ SOLN
5000.0000 [IU] | Freq: Three times a day (TID) | INTRAMUSCULAR | Status: DC
  Administered 2019-10-08 – 2019-10-14 (×18): 5000 [IU] via SUBCUTANEOUS
  Filled 2019-10-08 (×19): qty 1

## 2019-10-08 MED ORDER — SODIUM CHLORIDE 0.9 % IV SOLN
1000.0000 mL | INTRAVENOUS | Status: DC
  Administered 2019-10-08: 01:00:00 1000 mL via INTRAVENOUS

## 2019-10-08 MED ORDER — SODIUM CHLORIDE 0.9 % IV SOLN
1000.0000 mL | INTRAVENOUS | Status: AC
  Administered 2019-10-08: 04:00:00 1000 mL via INTRAVENOUS

## 2019-10-08 MED ORDER — ACETAMINOPHEN 650 MG RE SUPP: 650.0000 mg | Freq: Four times a day (QID) | RECTAL | Status: DC | PRN

## 2019-10-08 NOTE — ED Provider Notes (Signed)
[facility] DEPT Provider Note   CSN: [id number] Arrival date & time: 10/08/19  0016   Time seen 12:33 AM  History Chief Complaint  Patient presents with  . COVID+  . Urinary Tract Infection   Level 5 caveat for altered mental status  [facility] is a 86 y.o. female.  HPI    Patient presents via EMS.  When I first asked her what is going on tonight she states "I do not know".  She then states she is weak, she has dyspnea on exertion and mild chest pain and she points to her Epigastric area.  She denies nausea, vomiting, or diarrhea.  She has a cough and is producing sputum.  She complains of sore throat and rhinorrhea.  Family told EMS that patient is normally independent and able to walk without assisted but now she is so weak she is unable to walk without assistance or to feed herself.  According to family she is being treated for a UTI.  PCP [name], MD   Past Medical History:  Diagnosis Date  . Chronic back pain greater than 3 months duration   . Chronic pain   . Fibromyalgia   . Osteoarthritis   . PE (pulmonary embolism)   . Skin cancer   . Stroke [facility])     There are no problems to display for this patient.   Past Surgical History:  Procedure Laterality Date  . ABDOMINAL HYSTERECTOMY    . CATARACT EXTRACTION, BILATERAL    . CHOLECYSTECTOMY    . FOOT SURGERY    . HAND SURGERY    . INSERTION OF MESH    . WRIST FRACTURE SURGERY       OB History   No obstetric history on file.     No family history on file.  Social History   Tobacco Use  . Smoking status: Never Smoker  . Smokeless tobacco: Never Used  Substance Use Topics  . Alcohol use: No  . Drug use: No  living with daughter  Home Medications Prior to Admission medications   Medication Sig Start Date End Date Taking? Authorizing Provider  cephALEXin (KEFLEX) 500 MG capsule Take 500 mg by mouth 2 (two) times daily. 10/06/19  Yes [provider]   cetirizine (ZYRTEC) 10 MG tablet Take 10 mg by mouth daily.   Yes [provider]  clonazePAM (KLONOPIN) 1 MG tablet Take 1.5 mg by mouth daily.    Yes [provider]  Cyanocobalamin (VITAMIN B-12 IJ) Inject 1,000 mcg as directed every 30 (thirty) days.   Yes [provider]  metoprolol succinate (TOPROL-XL) 25 MG 24 hr tablet Take 0.5 tablets by mouth daily. 08/01/19  Yes [provider]  mirtazapine (REMERON) 45 MG tablet Take 45 mg by mouth at bedtime. 08/01/19  Yes [provider]  pantoprazole (PROTONIX) 40 MG tablet Take 1 tablet by mouth 2 (two) times daily. 08/01/19  Yes [provider]  rOPINIRole (REQUIP) 2 MG tablet Take 7 mg by mouth at bedtime.    Yes [provider]  sertraline (ZOLOFT) 100 MG tablet Take 100 mg by mouth daily.   Yes [provider]  trimethoprim (TRIMPEX) 100 MG tablet Take 100 mg by mouth 2 (two) times daily.   Yes [provider]    Allergies    Benzocaine, Lovenox [enoxaparin sodium], Stadol [butorphanol], Tape, Vistaril [hydroxyzine hcl], Warfarin and related, Demerol [meperidine], Dilaudid [hydromorphone hcl], and Macrobid [nitrofurantoin]  Review of  Systems   Review of Systems  Unable to perform ROS: Age    Physical Exam Updated Vital Signs BP (!) 114/52   Pulse 71   Temp 99.9 F (37.7 C) (Oral)   Resp (!) 23   SpO2 93%   Physical Exam Vitals and nursing note reviewed.  Constitutional:      Appearance: Normal appearance.  HENT:     Head: Normocephalic and atraumatic.     Right Ear: External ear normal.     Left Ear: External ear normal.     Mouth/Throat:     Mouth: Mucous membranes are dry.  Eyes:     Extraocular Movements: Extraocular movements intact.     Conjunctiva/sclera: Conjunctivae normal.     Pupils: Pupils are equal, round, and reactive to light.  Cardiovascular:     Rate and Rhythm: Normal rate and regular rhythm.     Pulses: Normal pulses.      Heart sounds: Normal heart sounds.  Pulmonary:     Effort: Pulmonary effort is normal. No respiratory distress.     Breath sounds: Normal breath sounds. No wheezing or rales.  Abdominal:     General: Abdomen is flat. Bowel sounds are normal.     Palpations: Abdomen is soft.     Tenderness: There is no abdominal tenderness.  Musculoskeletal:        General: Normal range of motion.     Cervical back: Normal range of motion.     Right lower leg: No edema.     Left lower leg: No edema.  Skin:    General: Skin is warm and dry.  Neurological:     General: No focal deficit present.     Mental Status: She is alert.     Cranial Nerves: No cranial nerve deficit.  Psychiatric:        Mood and Affect: Affect is flat.        Speech: Speech is delayed.        Behavior: Behavior is slowed.     ED Results / Procedures / Treatments   Labs Results for orders placed or performed during the hospital encounter of 10/08/19  Lactic acid, plasma  Result Value Ref Range   Lactic Acid, Venous 0.6 0.5 - 1.9 mmol/L  CBC WITH DIFFERENTIAL  Result Value Ref Range   WBC 3.7 (L) 4.0 - 10.5 K/uL   RBC 3.05 (L) 3.87 - 5.11 MIL/uL   Hemoglobin 10.5 (L) 12.0 - 15.0 g/dL   HCT 32.2 (L) 36.0 - 46.0 %   MCV 105.6 (H) 80.0 - 100.0 fL   MCH 34.4 (H) 26.0 - 34.0 pg   MCHC 32.6 30.0 - 36.0 g/dL   RDW 12.6 11.5 - 15.5 %   Platelets 180 150 - 400 K/uL   nRBC 0.0 0.0 - 0.2 %   Neutrophils Relative % 69 %   Neutro Abs 2.6 1.7 - 7.7 K/uL   Lymphocytes Relative 23 %   Lymphs Abs 0.9 0.7 - 4.0 K/uL   Monocytes Relative 7 %   Monocytes Absolute 0.3 0.1 - 1.0 K/uL   Eosinophils Relative 0 %   Eosinophils Absolute 0.0 0.0 - 0.5 K/uL   Basophils Relative 0 %   Basophils Absolute 0.0 0.0 - 0.1 K/uL   Immature Granulocytes 1 %   Abs Immature Granulocytes 0.02 0.00 - 0.07 K/uL  Comprehensive metabolic panel  Result Value Ref Range   Sodium 138 135 - 145 mmol/L   Potassium 3.8 3.5 -  5.1 mmol/L   Chloride 108 98  - 111 mmol/L   CO2 19 (L) 22 - 32 mmol/L   Glucose, Bld 119 (H) 70 - 99 mg/dL   BUN 35 (H) 8 - 23 mg/dL   Creatinine, Ser 1.60 (H) 0.44 - 1.00 mg/dL   Calcium 8.3 (L) 8.9 - 10.3 mg/dL   Total Protein 6.5 6.5 - 8.1 g/dL   Albumin 3.3 (L) 3.5 - 5.0 g/dL   AST 24 15 - 41 U/L   ALT 19 0 - 44 U/L   Alkaline Phosphatase 44 38 - 126 U/L   Total Bilirubin 0.3 0.3 - 1.2 mg/dL   GFR calc non Af Amer 28 (L) >60 mL/min   GFR calc Af Amer 33 (L) >60 mL/min   Anion gap 11 5 - 15  D-dimer, quantitative  Result Value Ref Range   D-Dimer, Quant 2.14 (H) 0.00 - 0.50 ug/mL-FEU  Procalcitonin  Result Value Ref Range   Procalcitonin 0.11 ng/mL  Lactate dehydrogenase  Result Value Ref Range   LDH 205 (H) 98 - 192 U/L  Ferritin  Result Value Ref Range   Ferritin 298 11 - 307 ng/mL  Triglycerides  Result Value Ref Range   Triglycerides 268 (H) <150 mg/dL  Fibrinogen  Result Value Ref Range   Fibrinogen 597 (H) 210 - 475 mg/dL  C-reactive protein  Result Value Ref Range   CRP 10.6 (H) <1.0 mg/dL  Urinalysis, Routine w reflex microscopic  Result Value Ref Range   Color, Urine YELLOW YELLOW   APPearance CLEAR CLEAR   Specific Gravity, Urine 1.016 1.005 - 1.030   pH 5.0 5.0 - 8.0   Glucose, UA NEGATIVE NEGATIVE mg/dL   Hgb urine dipstick NEGATIVE NEGATIVE   Bilirubin Urine NEGATIVE NEGATIVE   Ketones, ur NEGATIVE NEGATIVE mg/dL   Protein, ur 30 (A) NEGATIVE mg/dL   Nitrite NEGATIVE NEGATIVE   Leukocytes,Ua NEGATIVE NEGATIVE   RBC / HPF 0-5 0 - 5 RBC/hpf   WBC, UA 0-5 0 - 5 WBC/hpf   Bacteria, UA NONE SEEN NONE SEEN   Mucus PRESENT     Laboratory interpretation all normal except elevated inflammatory markers consistent with her diagnosis of Covid, low total white blood cell count without neutropenia, mild anemia, renal insufficiency without prior to compare      (all labs ordered are listed, but only abnormal results are displayed) Results for orders placed or performed during the  hospital encounter of 07/31/14  Urine culture   Specimen: Urine, Catheterized  Result Value Ref Range   Specimen Description URINE, CATHETERIZED    Special Requests Normal    Culture  Setup Time      07/31/2014 22:12 Performed at [place]      >=100,000 COLONIES/ML Performed at [place] Performed at [facility]    Report Status 08/02/2014 FINAL    Organism ID, Bacteria ESCHERICHIA COLI       Susceptibility   Escherichia coli - MIC*    AMPICILLIN 4 SENSITIVE Sensitive     CEFAZOLIN <=4 SENSITIVE Sensitive     CEFTRIAXONE <=1 SENSITIVE Sensitive     CIPROFLOXACIN <=0.25 SENSITIVE Sensitive     GENTAMICIN <=1 SENSITIVE Sensitive     LEVOFLOXACIN <=0.12 SENSITIVE Sensitive     NITROFURANTOIN <=16 SENSITIVE Sensitive     TOBRAMYCIN <=1 SENSITIVE Sensitive     TRIMETH/SULFA <=20 SENSITIVE Sensitive  PIP/TAZO <=4 SENSITIVE Sensitive     * ESCHERICHIA COLI  Urinalysis, Routine w reflex microscopic  Result Value Ref Range   Color, Urine YELLOW YELLOW   APPearance CLEAR CLEAR   Specific Gravity, Urine 1.018 1.005 - 1.030   pH 5.5 5.0 - 8.0   Glucose, UA NEGATIVE NEGATIVE mg/dL   Hgb urine dipstick NEGATIVE NEGATIVE   Bilirubin Urine NEGATIVE NEGATIVE   Ketones, ur NEGATIVE NEGATIVE mg/dL   Protein, ur NEGATIVE NEGATIVE mg/dL   Urobilinogen, UA 0.2 0.0 - 1.0 mg/dL   Nitrite POSITIVE (A) NEGATIVE   Leukocytes, UA TRACE (A) NEGATIVE  Urine microscopic-add on  Result Value Ref Range   Squamous Epithelial / LPF FEW (A) RARE   WBC, UA 3-6 <3 WBC/hpf   RBC / HPF 0-2 <3 RBC/hpf   Bacteria, UA MANY (A) RARE      EKG EKG Interpretation  Date/Time:  Sunday October 08 2019 00:26:43 EST Ventricular Rate:  81 PR Interval:    QRS Duration: 88 QT Interval:  376 QTC Calculation: 437 R Axis:   -11 Text Interpretation: Sinus rhythm Low voltage, precordial leads No significant change since last  tracing 31 Jul 2014 Confirmed by Rolland Porter [phone]) on 10/08/2019 2:04:40 AM   Radiology DG Chest Port 1 View  Result Date: 10/08/2019 CLINICAL DATA:  Shortness of breath. COVID positive. EXAM: PORTABLE CHEST 1 VIEW COMPARISON:  Radiograph 01/17/2018, CT 10/27/2014 FINDINGS: Heart is normal in size. Retrocardiac hiatal hernia. No focal airspace disease. No pleural fluid, pulmonary edema or pneumothorax. Bones are diffusely under mineralized. IMPRESSION: No evidence of pneumonia.  Hiatal hernia. Electronically Signed   By: Keith Rake M.D.   On: 10/08/2019 01:24    Procedures Procedures (including critical care time)  Medications Ordered in ED Medications  0.9 %  sodium chloride infusion (1,000 mLs Intravenous New Bag/Given 10/08/19 0119)  sodium chloride 0.9 % bolus 500 mL (0 mLs Intravenous Stopped 10/08/19 0216)    ED Course  I have reviewed the triage vital signs and the nursing notes.  Pertinent labs & imaging results that were available during my care of the patient were reviewed by me and considered in my medical decision making (see chart for details).  Pulse ox 92% on RA    MDM Rules/Calculators/A&P                     Patient appeared to be dehydrated, she was given IV fluids.  Laboratory testing was done.  Patient is D-dimer is moderately elevated however her other inflammatory markers are high.  I am unable to do CTA of her chest because of her creatinine clearance.  VQ scan can be done later today if the admitting doctors want it done.  Patient reportedly was positive for Covid however it is not in our system.  A  Covid test was done.  Due to her weakness and inability to walk without assistance I thought patient should be admitted.  She has not required oxygen although her pulse ox has been mildly low at 92%.  Her chest x-ray does not show any obvious infiltrates.  Her urinary tract infection has been treated appropriately with trimethoprim based on her culture from  January 10.  3:21 AM Dr. Myna Hidalgo, hospitalist will admit  Leamber Fichter Withers was evaluated in Emergency Department on 10/08/2019 for the symptoms described in the history of present illness. She was evaluated in the context of the global COVID-19 pandemic, which necessitated consideration that the patient  might be at risk for infection with the SARS-CoV-2 virus that causes COVID-19. Institutional protocols and algorithms that pertain to the evaluation of patients at risk for COVID-19 are in a state of rapid change based on information released by regulatory bodies including the CDC and federal and state organizations. These policies and algorithms were followed during the patient's care in the ED.   Final Clinical Impression(s) / ED Diagnoses Final diagnoses:  COVID-19  Weakness  Dehydration  Lower urinary tract infectious disease    Rx / DC Orders Plan admission  Rolland Porter, MD, Barbette Or, MD 10/08/19 [phone]

## 2019-10-08 NOTE — ED Notes (Signed)
Patient called out that she had to have a BM, assisted to bedside commode. Patient was continent with diarrhea and urine. Assisted back into the bed and new pure-wick applied. Linens and bed changed for fresh sheets.

## 2019-10-08 NOTE — Progress Notes (Signed)
Chart reviewed.  Patient seen and examined.  Awake, alert, oriented x3.  Updated patient's daughter via telephone.  Noted she is currently not on remdesivir, dexamethasone.  Currently on room air with no chest x-ray changes but does have elevated CRP of 10.  Low threshold to start remdesivir/dexamethasone in case of a new oxygen requirement or worsening/persistent tachypnea.  Budd Palmer, MD Internal Medicine  Hospitalist

## 2019-10-08 NOTE — ED Notes (Addendum)
Second attempt to call report unsuccessful will await call back.

## 2019-10-08 NOTE — ED Notes (Signed)
Attempted to call report to receiving nurse, unavailable. Will try again in few minutes

## 2019-10-08 NOTE — ED Notes (Signed)
ED TO INPATIENT HANDOFF REPORT  ED Nurse Name and Phone #: Lorre Nick RN   S Name/Age/Gender Brittney Hicks 86 y.o. female Room/Bed: WA20/WA20  Code Status   Code Status: DNR  Home/SNF/Other Home Patient oriented to: self, place, time and situation Is this baseline? Yes   Triage Complete: Triage complete  Chief Complaint COVID-19 virus infection [U07.1]  Triage Note Pt presents to ED via [facility] coming from home. Family called out for pt due to increased weakness since being diagnosed with COVID on Friday. Pt is normally independent and able to walk unassisted and CA&Ox4 but is so weak now she needs assistance to eat and walk. Pt has also been being treated for a UTI and is currently taken antibiotics. EMS reports pt VS: 118/66, 92% on RA, 98% 2 LPM, Rhonchi in all fields. Daughter on scene reports that pt is a DNR but pt was staying with family at form was not at that residence.  Daughter: [name] [phone]    Allergies Allergies  Allergen Reactions  . Benzocaine Other (See Comments)    Oral blisters  . Lovenox [Enoxaparin Sodium] Other (See Comments)    Has received this med without issue since it was listed as an allergy  . Stadol [Butorphanol]     Had a stroke after receiving  . Tape Other (See Comments)    blisters  . Vistaril [Hydroxyzine Hcl]     Had a stroke after receiving  . Warfarin And Related Other (See Comments)    Hematoma while on this and lovenox  . Demerol [Meperidine] Rash  . Dilaudid [Hydromorphone Hcl] Itching and Rash  . Macrobid [Nitrofurantoin] Rash    Level of Care/Admitting Diagnosis ED Disposition    ED Disposition Condition Comment   Admit  Hospital Area: [place] [id number]  Level of Care: Telemetry [5]  Admit to tele based on following criteria: Monitor for Ischemic changes  Covid Evaluation: Confirmed COVID Positive  Diagnosis: COVID-19 virus infection [id number]  Admitting Physician: Vianne Bulls  [id number]  Attending Physician: Vianne Bulls [id number]       B Medical/Surgery History Past Medical History:  Diagnosis Date  . Chronic back pain greater than 3 months duration   . Chronic pain   . Fibromyalgia   . Osteoarthritis   . PE (pulmonary embolism)   . Skin cancer   . Stroke [facility])    Past Surgical History:  Procedure Laterality Date  . ABDOMINAL HYSTERECTOMY    . CATARACT EXTRACTION, BILATERAL    . CHOLECYSTECTOMY    . FOOT SURGERY    . HAND SURGERY    . INSERTION OF MESH    . WRIST FRACTURE SURGERY       A IV Location/Drains/Wounds Patient Lines/Drains/Airways Status   Active Line/Drains/Airways    Name:   Placement date:   Placement time:   Site:   Days:   Peripheral IV 10/08/19 Left Antecubital   10/08/19    0119    Antecubital   less than 1   External Urinary Catheter   10/08/19    0029    --   less than 1          Intake/Output Last 24 hours  Intake/Output Summary (Last 24 hours) at 10/08/2019 2118 Last data filed at 10/08/2019 1248 Gross per 24 hour  Intake 1709.58 ml  Output --  Net 1709.58 ml    Labs/Imaging Results for orders placed or performed during the hospital encounter of 10/08/19 (from  the past 48 hour(s))  SARS CORONAVIRUS 2 (TAT 6-24 HRS) Nasopharyngeal Nasopharyngeal Swab     Status: Abnormal   Collection Time: 10/08/19 12:40 AM   Specimen: Nasopharyngeal Swab  Result Value Ref Range   SARS Coronavirus 2 POSITIVE (A) NEGATIVE    Comment: RESULT CALLED TO, READ BACK BY AND VERIFIED WITH: [place] GARRISON RN.@[email or web address]  ON 1.17.21 BY TCALDWELL MT. (NOTE) SARS-CoV-2 target nucleic acids are DETECTED. The SARS-CoV-2 RNA is generally detectable in upper and lower respiratory specimens during the acute phase of infection. Positive results are indicative of the presence of SARS-CoV-2 RNA. Clinical correlation with patient history and other diagnostic information is  necessary to determine patient infection status. Positive results  do not rule out bacterial infection or co-infection with other viruses.  The expected result is Negative. Fact Sheet for Patients: SugarRoll.be Fact Sheet for Healthcare Providers: https://www.woods-mathews.com/ This test is not yet approved or cleared by the [place] FDA and  has been authorized for detection and/or diagnosis of SARS-CoV-2 by FDA under an Emergency Use Authorization (EUA). This EUA will remain  in effect (meaning this test ca n be used) for the duration of the COVID-19 declaration under Section 564(b)(1) of the Act, 21 U.S.C. section 360bbb-3(b)(1), unless the authorization is terminated or revoked sooner. Performed at [facility], [place] [address] [place], [place] [address]   Lactic acid, plasma     Status: None   Collection Time: 10/08/19 12:40 AM  Result Value Ref Range   Lactic Acid, Venous 0.6 0.5 - 1.9 mmol/L    Comment: Performed at [facility], [place] [address] [place], [place] [address]  CBC WITH DIFFERENTIAL     Status: Abnormal   Collection Time: 10/08/19 12:40 AM  Result Value Ref Range   WBC 3.7 (L) 4.0 - 10.5 K/uL   RBC 3.05 (L) 3.87 - 5.11 MIL/uL   Hemoglobin 10.5 (L) 12.0 - 15.0 g/dL   HCT 32.2 (L) 36.0 - 46.0 %   MCV 105.6 (H) 80.0 - 100.0 fL   MCH 34.4 (H) 26.0 - 34.0 pg   MCHC 32.6 30.0 - 36.0 g/dL   RDW 12.6 11.5 - 15.5 %   Platelets 180 150 - 400 K/uL   nRBC 0.0 0.0 - 0.2 %   Neutrophils Relative % 69 %   Neutro Abs 2.6 1.7 - 7.7 K/uL   Lymphocytes Relative 23 %   Lymphs Abs 0.9 0.7 - 4.0 K/uL   Monocytes Relative 7 %   Monocytes Absolute 0.3 0.1 - 1.0 K/uL   Eosinophils Relative 0 %   Eosinophils Absolute 0.0 0.0 - 0.5 K/uL   Basophils Relative 0 %   Basophils Absolute 0.0 0.0 - 0.1 K/uL   Immature Granulocytes 1 %   Abs Immature Granulocytes 0.02 0.00 - 0.07 K/uL    Comment: Performed at [facility], [place] [address] [place], [place]  [address]  Comprehensive metabolic panel     Status: Abnormal   Collection Time: 10/08/19 12:40 AM  Result Value Ref Range   Sodium 138 135 - 145 mmol/L   Potassium 3.8 3.5 - 5.1 mmol/L   Chloride 108 98 - 111 mmol/L   CO2 19 (L) 22 - 32 mmol/L   Glucose, Bld 119 (H) 70 - 99 mg/dL   BUN 35 (H) 8 - 23 mg/dL   Creatinine, Ser 1.60 (H) 0.44 - 1.00 mg/dL   Calcium 8.3 (L) 8.9 - 10.3 mg/dL   Total Protein 6.5 6.5 -  8.1 g/dL   Albumin 3.3 (L) 3.5 - 5.0 g/dL   AST 24 15 - 41 U/L   ALT 19 0 - 44 U/L   Alkaline Phosphatase 44 38 - 126 U/L   Total Bilirubin 0.3 0.3 - 1.2 mg/dL   GFR calc non Af Amer 28 (L) >60 mL/min   GFR calc Af Amer 33 (L) >60 mL/min   Anion gap 11 5 - 15    Comment: Performed at [facility], [place] [address] [place], [place] [address]  D-dimer, quantitative     Status: Abnormal   Collection Time: 10/08/19 12:40 AM  Result Value Ref Range   D-Dimer, Quant 2.14 (H) 0.00 - 0.50 ug/mL-FEU    Comment: (NOTE) At the manufacturer cut-off of 0.50 ug/mL FEU, this assay has been documented to exclude PE with a sensitivity and negative predictive value of 97 to 99%.  At this time, this assay has not been approved by the FDA to exclude DVT/VTE. Results should be correlated with clinical presentation. Performed at [facility], [place] [address], [place], [place] [address]   Procalcitonin     Status: None   Collection Time: 10/08/19 12:40 AM  Result Value Ref Range   Procalcitonin 0.11 ng/mL    Comment:        Interpretation: PCT (Procalcitonin) <= 0.5 ng/mL: Systemic infection (sepsis) is not likely. Local bacterial infection is possible. (NOTE)       Sepsis PCT Algorithm           Lower Respiratory Tract                                      Infection PCT Algorithm    ----------------------------     ----------------------------         PCT < 0.25 ng/mL                PCT < 0.10 ng/mL         Strongly encourage             Strongly  discourage   discontinuation of antibiotics    initiation of antibiotics    ----------------------------     -----------------------------       PCT 0.25 - 0.50 ng/mL            PCT 0.10 - 0.25 ng/mL               OR       >80% decrease in PCT            Discourage initiation of                                            antibiotics      Encourage discontinuation           of antibiotics    ----------------------------     -----------------------------         PCT >= 0.50 ng/mL              PCT 0.26 - 0.50 ng/mL               AND        <80% decrease in PCT             Encourage initiation  of                                             antibiotics       Encourage continuation           of antibiotics    ----------------------------     -----------------------------        PCT >= 0.50 ng/mL                  PCT > 0.50 ng/mL               AND         increase in PCT                  Strongly encourage                                      initiation of antibiotics    Strongly encourage escalation           of antibiotics                                     -----------------------------                                           PCT <= 0.25 ng/mL                                                 OR                                        > 80% decrease in PCT                                     Discontinue / Do not initiate                                             antibiotics Performed at [place] [address] [place] [address] [place], [place] [address]   Lactate dehydrogenase     Status: Abnormal   Collection Time: 10/08/19 12:40 AM  Result Value Ref Range   LDH 205 (H) 98 - 192 U/L    Comment: Performed at [facility], [place] [address] [place], [place] [address]  Ferritin     Status: None   Collection Time: 10/08/19 12:40 AM  Result Value Ref Range   Ferritin 298 11 - 307 ng/mL    Comment: Performed at [facility], [place] [address] [place], [place] [address]  Triglycerides     Status: Abnormal   Collection Time: 10/08/19 12:40 AM  Result Value Ref Range  Triglycerides 268 (H) <150 mg/dL    Comment: Performed at [facility], [place] [address] [place], [place] [address]  Fibrinogen     Status: Abnormal   Collection Time: 10/08/19 12:40 AM  Result Value Ref Range   Fibrinogen 597 (H) 210 - 475 mg/dL    Comment: Performed at [facility], [place] [address], [place], [place] [address]  C-reactive protein     Status: Abnormal   Collection Time: 10/08/19 12:40 AM  Result Value Ref Range   CRP 10.6 (H) <1.0 mg/dL    Comment: Performed at [facility], [place] [address] [place], [place] [address]  Urinalysis, Routine w reflex microscopic     Status: Abnormal   Collection Time: 10/08/19 12:42 AM  Result Value Ref Range   Color, Urine YELLOW YELLOW   APPearance CLEAR CLEAR   Specific Gravity, Urine 1.016 1.005 - 1.030   pH 5.0 5.0 - 8.0   Glucose, UA NEGATIVE NEGATIVE mg/dL   Hgb urine dipstick NEGATIVE NEGATIVE   Bilirubin Urine NEGATIVE NEGATIVE   Ketones, ur NEGATIVE NEGATIVE mg/dL   Protein, ur 30 (A) NEGATIVE mg/dL   Nitrite NEGATIVE NEGATIVE   Leukocytes,Ua NEGATIVE NEGATIVE   RBC / HPF 0-5 0 - 5 RBC/hpf   WBC, UA 0-5 0 - 5 WBC/hpf   Bacteria, UA NONE SEEN NONE SEEN   Mucus PRESENT     Comment: Performed at [facility], [place] [address] [place], [place] [address]  Blood Culture (routine x 2)     Status: None (Preliminary result)   Collection Time: 10/08/19  1:07 AM   Specimen: BLOOD  Result Value Ref Range   Specimen Description      BLOOD LEFT ANTECUBITAL Performed at [facility], [place] [address], [place], [place] [address]    Special Requests      BOTTLES DRAWN AEROBIC AND ANAEROBIC Blood Culture results may not be optimal due to an excessive volume of blood received in culture bottles Performed at [place] [address], [place], [place] [address]    Culture PENDING    Report Status PENDING   ABO/Rh     Status: None   Collection Time: 10/08/19  4:15 AM  Result Value Ref Range   ABO/RH(D)      O POS Performed at [facility], [place] [address] [place], [place] [address]   TSH     Status: None   Collection Time: 10/08/19  4:40 AM  Result Value Ref Range   TSH 0.595 0.350 - 4.500 uIU/mL    Comment: Performed by a 3rd Generation assay with a functional sensitivity of <=0.01 uIU/mL. Performed at [facility], [place] [address], [place], [place] [address]   CK     Status: None   Collection Time: 10/08/19  4:40 AM  Result Value Ref Range   Total CK 184 38 - 234 U/L    Comment: Performed at [facility], [place] [address], [place], [place] [address]  Vitamin B12     Status: Abnormal   Collection Time: 10/08/19  4:40 AM  Result Value Ref Range   Vitamin B-12 1,146 (H) 180 - 914 pg/mL    Comment: (NOTE) This assay is not validated for testing neonatal or myeloproliferative syndrome specimens for Vitamin B12 levels. Performed at [facility], [place] [address] [place], [place] [address]    DG Chest Keeler [address]  Result Date: 10/08/2019 CLINICAL DATA:  Shortness of breath. COVID positive. EXAM: PORTABLE CHEST 1 VIEW COMPARISON:  Radiograph 01/17/2018, CT 10/27/2014 FINDINGS: Heart is normal in size. Retrocardiac hiatal hernia. No focal airspace disease. No pleural fluid, pulmonary edema or pneumothorax. Bones are diffusely under mineralized. IMPRESSION: No evidence of pneumonia.  Hiatal hernia. Electronically Signed   By: Keith Rake M.D.   On: 10/08/2019 01:24   VAS [place] LOWER EXTREMITY VENOUS (DVT)  Result Date: 10/08/2019  Lower Venous Study Indications: Covid-19, elevated D-Dimer.  Comparison Study: No prior study Performing Technologist: Sharion Dove RVS  Examination Guidelines: A complete evaluation  includes B-mode imaging, spectral Doppler, color Doppler, and power Doppler as needed of all accessible portions of each vessel. Bilateral testing is considered an integral part of a complete examination. Limited examinations for reoccurring indications may be performed as noted.  +---------+---------------+---------+-----------+----------+--------------+ RIGHT    CompressibilityPhasicitySpontaneityPropertiesThrombus Aging +---------+---------------+---------+-----------+----------+--------------+ CFV      Full           Yes      Yes                                 +---------+---------------+---------+-----------+----------+--------------+ SFJ      Full                                                        +---------+---------------+---------+-----------+----------+--------------+ FV Prox  Full                                                        +---------+---------------+---------+-----------+----------+--------------+ FV Mid   Full                                                        +---------+---------------+---------+-----------+----------+--------------+ FV DistalFull                                                        +---------+---------------+---------+-----------+----------+--------------+ PFV      Full                                                        +---------+---------------+---------+-----------+----------+--------------+ POP      Full           Yes      Yes                                 +---------+---------------+---------+-----------+----------+--------------+ PTV      Full                                                        +---------+---------------+---------+-----------+----------+--------------+  PERO     Full                                                        +---------+---------------+---------+-----------+----------+--------------+    +---------+---------------+---------+-----------+----------+--------------+ LEFT     CompressibilityPhasicitySpontaneityPropertiesThrombus Aging +---------+---------------+---------+-----------+----------+--------------+ CFV      Full           Yes      Yes                                 +---------+---------------+---------+-----------+----------+--------------+ SFJ      Full                                                        +---------+---------------+---------+-----------+----------+--------------+ FV Prox  Full                                                        +---------+---------------+---------+-----------+----------+--------------+ FV Mid   Full                                                        +---------+---------------+---------+-----------+----------+--------------+ FV DistalFull                                                        +---------+---------------+---------+-----------+----------+--------------+ PFV      Full                                                        +---------+---------------+---------+-----------+----------+--------------+ POP      Full           Yes      Yes                                 +---------+---------------+---------+-----------+----------+--------------+ PTV      Full                                                        +---------+---------------+---------+-----------+----------+--------------+ PERO     Full                                                        +---------+---------------+---------+-----------+----------+--------------+  Summary: Right: There is no evidence of deep vein thrombosis in the lower extremity. Left: There is no evidence of deep vein thrombosis in the lower extremity.  *See table(s) above for measurements and observations. Electronically signed by Monica Martinez MD on 10/08/2019 at 3:57:24 PM.    Final     Pending Labs Unresulted Labs (From  admission, onward)    Start     Ordered   10/09/19 0500  CBC with Differential/Platelet  Daily,   R     10/08/19 0350   10/09/19 0500  Comprehensive metabolic panel  Daily,   R     10/08/19 0350   10/09/19 0500  C-reactive protein  Daily,   R     10/08/19 0350   10/09/19 0500  D-dimer, quantitative (not at [facility])  Daily,   R     10/08/19 0350   10/09/19 0500  Magnesium  Tomorrow morning,   R     10/08/19 0350   10/09/19 0500  Ferritin  Daily,   R     10/08/19 0350   10/08/19 0500  Folate RBC  Tomorrow morning,   R     10/08/19 0350   10/08/19 0042  Urine culture  ONCE - STAT,   STAT    Question:  Patient immune status  Answer:  Normal   10/08/19 0041   10/08/19 0040  Blood Culture (routine x 2)  BLOOD CULTURE X 2,   STAT     10/08/19 0041          Vitals/Pain Today's Vitals   10/08/19 1730 10/08/19 1830 10/08/19 1900 10/08/19 2000  BP: 126/63 (!) 140/94 133/63 136/76  Pulse: 92 (!) 101 (!) 103 (!) 119  Resp: (!) 21 (!) 22 (!) 21 (!) 26  Temp:      TempSrc:      SpO2: 98% 96% 96% 95%    Isolation Precautions Airborne and Contact precautions  Medications Medications  0.9 %  sodium chloride infusion (0 mLs Intravenous Stopped 10/08/19 1248)  cephALEXin (KEFLEX) capsule 500 mg (500 mg Oral Given 10/08/19 0913)  pantoprazole (PROTONIX) EC tablet 40 mg (40 mg Oral Given 10/08/19 0913)  heparin injection 5,000 Units (5,000 Units Subcutaneous Given 10/08/19 1445)  sodium chloride flush (NS) 0.9 % injection 3 mL (3 mLs Intravenous Given 10/08/19 0919)  acetaminophen (TYLENOL) tablet 650 mg (has no administration in time range)    Or  acetaminophen (TYLENOL) suppository 650 mg (has no administration in time range)  senna-docusate (Senokot-S) tablet 1 tablet (has no administration in time range)  ondansetron (ZOFRAN) tablet 4 mg (has no administration in time range)    Or  ondansetron (ZOFRAN) injection 4 mg (has no administration in time range)  guaiFENesin-dextromethorphan  (ROBITUSSIN DM) 100-10 MG/5ML syrup 10 mL (has no administration in time range)  sodium chloride 0.9 % bolus 500 mL (0 mLs Intravenous Stopped 10/08/19 0216)    Mobility walks with device Moderate fall risk   Focused Assessments Cardiac Assessment Handoff:    Lab Results  Component Value Date   CKTOTAL 184 10/08/2019   Lab Results  Component Value Date   DDIMER 2.14 (H) 10/08/2019   Does the Patient currently have chest pain? No  , Pulmonary Assessment Handoff:  Lung sounds:   O2 Device: Room Air O2 Flow Rate (L/min): 2 L/min      R Recommendations: See Admitting Provider Note  Report given to:   Additional Notes:

## 2019-10-08 NOTE — Progress Notes (Signed)
VASCULAR LAB PRELIMINARY  PRELIMINARY  PRELIMINARY  PRELIMINARY  Bilateral lower extremity venous duplex completed.    Preliminary report:  See CV proc for preliminary results.   Prerana Strayer, RVT 10/08/2019, 2:47 PM

## 2019-10-08 NOTE — ED Notes (Signed)
Provided pt with another warm blanket

## 2019-10-08 NOTE — H&P (Signed)
History and Physical    Brittney Hicks [id number] DOB: March 24, 1929 DOA: 10/08/2019  PCP: [name], MD   Patient coming from: Home   Chief Complaint: Lethargy, fatigue   HPI: Brittney Hicks is a 86 y.o. female with medical history significant for history of CVA, chronic pain, recurrent UTIs on suppressive trimethoprim, and COVID-19 diagnosis on 10/06/2019 ([facility]; visible in [place]), now presenting to the ED from home with progressive lethargy and fatigue.  At her baseline, the patient is able to ambulate, feed, and dress herself independently, but with her progressive fatigue and lethargy, she has been unable to do any of this without assistance.  She reports pain in her right leg and general aches, but denies chest pain, abdominal pain, or shortness of breath.  ED Course: Upon arrival to the ED, patient is found to be afebrile, saturating mid 90s on room air, and with blood pressure 96/50.  EKG features sinus rhythm and chest x-ray is negative for pneumonia.  Chemistry panel with creatinine 1.60.  CBC notable for WBC 3700 and microcytic anemia with hemoglobin 10.5.  D-dimer was elevated to 2.14 and pro calcitonin is 0.11.  Blood and urine cultures were collected in the emergency department, 500 cc normal saline was administered, and hospitalists consulted for admission.  Review of Systems:  All other systems reviewed and apart from HPI, are negative.  Past Medical History:  Diagnosis Date  . Chronic back pain greater than 3 months duration   . Chronic pain   . Fibromyalgia   . Osteoarthritis   . PE (pulmonary embolism)   . Skin cancer   . Stroke [facility])     Past Surgical History:  Procedure Laterality Date  . ABDOMINAL HYSTERECTOMY    . CATARACT EXTRACTION, BILATERAL    . CHOLECYSTECTOMY    . FOOT SURGERY    . HAND SURGERY    . INSERTION OF MESH    . WRIST FRACTURE SURGERY       reports that she has never smoked. She has never used smokeless  tobacco. She reports that she does not drink alcohol or use drugs.  Allergies  Allergen Reactions  . Benzocaine Other (See Comments)    Oral blisters  . Lovenox [Enoxaparin Sodium] Other (See Comments)    Has received this med without issue since it was listed as an allergy  . Stadol [Butorphanol]     Had a stroke after receiving  . Tape Other (See Comments)    blisters  . Vistaril [Hydroxyzine Hcl]     Had a stroke after receiving  . Warfarin And Related Other (See Comments)    Hematoma while on this and lovenox  . Demerol [Meperidine] Rash  . Dilaudid [Hydromorphone Hcl] Itching and Rash  . Macrobid [Nitrofurantoin] Rash    History reviewed. No pertinent family history.   Prior to Admission medications   Medication Sig Start Date End Date Taking? Authorizing Provider  cephALEXin (KEFLEX) 500 MG capsule Take 500 mg by mouth 2 (two) times daily. 10/06/19  Yes [provider]  cetirizine (ZYRTEC) 10 MG tablet Take 10 mg by mouth daily.   Yes [provider]  clonazePAM (KLONOPIN) 1 MG tablet Take 1.5 mg by mouth daily.    Yes [provider]  Cyanocobalamin (VITAMIN B-12 IJ) Inject 1,000 mcg as directed every 30 (thirty) days.   Yes [provider]  metoprolol succinate (TOPROL-XL) 25 MG 24 hr tablet Take 0.5 tablets by mouth daily. 08/01/19  Yes [provider]  mirtazapine (REMERON) 45 MG tablet Take 45 mg by mouth at bedtime. 08/01/19  Yes [provider]  pantoprazole (PROTONIX) 40 MG tablet Take 1 tablet by mouth 2 (two) times daily. 08/01/19  Yes [provider]  rOPINIRole (REQUIP) 2 MG tablet Take 7 mg by mouth at bedtime.    Yes [provider]  sertraline (ZOLOFT) 100 MG tablet Take 100 mg by mouth daily.   Yes [provider]  trimethoprim (TRIMPEX) 100 MG tablet Take 100 mg by mouth 2 (two) times daily.   Yes [provider]    Physical Exam: Vitals:   10/08/19 0330 10/08/19  0441 10/08/19 0442 10/08/19 0500  BP: (!) 105/52 110/77  102/74  Pulse: 73 85  79  Resp: (!) 22 20  12   Temp:   (!) 97.5 F (36.4 C)   TempSrc:   Oral   SpO2: 93% 98%  97%    Constitutional: NAD, appears uncomfortable  Eyes: PERTLA, lids and conjunctivae normal ENMT: Mucous membranes are moist. Posterior pharynx clear of any exudate or lesions.   Neck: normal, supple, no masses, no thyromegaly Respiratory: no wheezing, no crackles. Normal respiratory effort. No accessory muscle use.  Cardiovascular: S1 & S2 heard, regular rate and rhythm. No extremity edema.   Abdomen: No distension, no tenderness, soft. Bowel sounds active.  Musculoskeletal: no clubbing / cyanosis. No joint deformity upper and lower extremities.    Skin: no significant rashes, lesions, ulcers. Warm, dry, well-perfused. Neurologic: No gross facial asymmetry. No dysarthria or aphasia. Sensation intact. Moving all extremitities.  Psychiatric: Lethargic, answering questions appropriately. Cooperative.    Labs on Admission: I have personally reviewed following labs and imaging studies  CBC: Recent Labs  Lab 10/08/19 0040  WBC 3.7*  NEUTROABS 2.6  HGB 10.5*  HCT 32.2*  MCV 105.6*  PLT [id number]   Basic Metabolic Panel: Recent Labs  Lab 10/08/19 0040  NA 138  K 3.8  CL 108  CO2 19*  GLUCOSE 119*  BUN 35*  CREATININE 1.60*  CALCIUM 8.3*   GFR: CrCl cannot be calculated (Unknown ideal weight.). Liver Function Tests: Recent Labs  Lab 10/08/19 0040  AST 24  ALT 19  ALKPHOS 44  BILITOT 0.3  PROT 6.5  ALBUMIN 3.3*   No results for input(s): LIPASE, AMYLASE in the last 168 hours. No results for input(s): AMMONIA in the last 168 hours. Coagulation Profile: No results for input(s): INR, PROTIME in the last 168 hours. Cardiac Enzymes: No results for input(s): CKTOTAL, CKMB, CKMBINDEX, TROPONINI in the last 168 hours. BNP (last 3 results) No results for input(s): PROBNP in the last 8760  hours. HbA1C: No results for input(s): HGBA1C in the last 72 hours. CBG: No results for input(s): GLUCAP in the last 168 hours. Lipid Profile: Recent Labs    10/08/19 0040  TRIG 268*   Thyroid Function Tests: No results for input(s): TSH, T4TOTAL, FREET4, T3FREE, THYROIDAB in the last 72 hours. Anemia Panel: Recent Labs    10/08/19 0040  FERRITIN 298   Urine analysis:    Component Value Date/Time   COLORURINE YELLOW 10/08/2019 0042   APPEARANCEUR CLEAR 10/08/2019 0042   LABSPEC 1.016 10/08/2019 0042   PHURINE 5.0 10/08/2019 0042   GLUCOSEU NEGATIVE 10/08/2019 0042   HGBUR NEGATIVE 10/08/2019 0042   BILIRUBINUR NEGATIVE 10/08/2019 0042   KETONESUR NEGATIVE 10/08/2019 0042   PROTEINUR 30 (A) 10/08/2019 0042   UROBILINOGEN 0.2 07/31/2014 1130   NITRITE NEGATIVE 10/08/2019  [place] 10/08/2019 0042   Sepsis Labs: @LABRCNTIP (procalcitonin:4,lacticidven:4) )No results found for this or any previous visit (from the past 240 hour(s)).   Radiological Exams on Admission: DG Chest Port 1 View  Result Date: 10/08/2019 CLINICAL DATA:  Shortness of breath. COVID positive. EXAM: PORTABLE CHEST 1 VIEW COMPARISON:  Radiograph 01/17/2018, CT 10/27/2014 FINDINGS: Heart is normal in size. Retrocardiac hiatal hernia. No focal airspace disease. No pleural fluid, pulmonary edema or pneumothorax. Bones are diffusely under mineralized. IMPRESSION: No evidence of pneumonia.  Hiatal hernia. Electronically Signed   By: Keith Rake M.D.   On: 10/08/2019 01:24    EKG: Independently reviewed. Sinus rhythm.   Assessment/Plan   1. Lethargy  - Presents with lethargy and fatigue, worsening to the point where she can no longer walk or feed herself without assistance  - There is no focal neurologic deficits noted and this is likely secondary the acute viral illness  - Check TSH and CK, check B12 and folate in light of macrocytosis, continue supportive care   2. COVID-19  infection  - Patient was diagnosed with COVID-19 on 1/1/5 at urgent care with results visible in Care Everywhere, and now presents due to lethargy and fatigue  - She denies SOB, has normal RR and oxygenation in ED, and no evidence for PNA on CXR, so plan for supportive care and monitoring only for now   3. CKD stage III  - SCr is 1.60 in ED, a little up from priors  - She was given IVF in ED  - Renally-dose medications, avoid nephrotoxins, repeat chem panel in am    4. Elevated d-dimer  - D-dimer is 2.14 in ED  - She denies CP or SOB, but reports right leg pain  - Check lower extremity venous US    5. Hypertension  - BP low in ED and antihypertensives held    6. UTI  - She was diagnosed with UTI at urgent care on 1/15 and started on Keflex, will complete course    7. Anxiety  - Presents with lethargy and fatigue, will hold potentially sedating medications for now    DVT prophylaxis: sq heparin  Code Status: DNR, confirmed with family  Family Communication: Daughter updated from ED   Consults called: None  Admission status: Observation     Vianne Bulls, MD [facility] Pager [phone]  If 7PM-7AM, please contact night-coverage [email or web address] Password [email or web address]  10/08/2019, 5:09 AM

## 2019-10-08 NOTE — ED Triage Notes (Signed)
Pt presents to ED via [facility] coming from home. Family called out for pt due to increased weakness since being diagnosed with COVID on Friday. Pt is normally independent and able to walk unassisted and CA&Ox4 but is so weak now she needs assistance to eat and walk. Pt has also been being treated for a UTI and is currently taken antibiotics. EMS reports pt VS: 118/66, 92% on RA, 98% 2 LPM, Rhonchi in all fields. Daughter on scene reports that pt is a DNR but pt was staying with family at form was not at that residence.  Daughter: [name] [phone]

## 2019-10-09 DIAGNOSIS — E876 Hypokalemia: Secondary | ICD-10-CM | POA: Diagnosis not present

## 2019-10-09 DIAGNOSIS — R5383 Other fatigue: Secondary | ICD-10-CM | POA: Diagnosis present

## 2019-10-09 DIAGNOSIS — Z885 Allergy status to narcotic agent status: Secondary | ICD-10-CM | POA: Diagnosis not present

## 2019-10-09 DIAGNOSIS — Z85828 Personal history of other malignant neoplasm of skin: Secondary | ICD-10-CM | POA: Diagnosis not present

## 2019-10-09 DIAGNOSIS — Z87440 Personal history of urinary (tract) infections: Secondary | ICD-10-CM | POA: Diagnosis not present

## 2019-10-09 DIAGNOSIS — M797 Fibromyalgia: Secondary | ICD-10-CM | POA: Diagnosis present

## 2019-10-09 DIAGNOSIS — N179 Acute kidney failure, unspecified: Secondary | ICD-10-CM | POA: Diagnosis present

## 2019-10-09 DIAGNOSIS — Z66 Do not resuscitate: Secondary | ICD-10-CM | POA: Diagnosis present

## 2019-10-09 DIAGNOSIS — D539 Nutritional anemia, unspecified: Secondary | ICD-10-CM | POA: Diagnosis present

## 2019-10-09 DIAGNOSIS — N390 Urinary tract infection, site not specified: Secondary | ICD-10-CM | POA: Diagnosis present

## 2019-10-09 DIAGNOSIS — Z888 Allergy status to other drugs, medicaments and biological substances status: Secondary | ICD-10-CM | POA: Diagnosis not present

## 2019-10-09 DIAGNOSIS — U071 COVID-19: Secondary | ICD-10-CM | POA: Diagnosis present

## 2019-10-09 DIAGNOSIS — Z90710 Acquired absence of both cervix and uterus: Secondary | ICD-10-CM | POA: Diagnosis not present

## 2019-10-09 DIAGNOSIS — Z9049 Acquired absence of other specified parts of digestive tract: Secondary | ICD-10-CM | POA: Diagnosis not present

## 2019-10-09 DIAGNOSIS — Z8673 Personal history of transient ischemic attack (TIA), and cerebral infarction without residual deficits: Secondary | ICD-10-CM | POA: Diagnosis not present

## 2019-10-09 DIAGNOSIS — K5900 Constipation, unspecified: Secondary | ICD-10-CM | POA: Diagnosis present

## 2019-10-09 DIAGNOSIS — K2100 Gastro-esophageal reflux disease with esophagitis, without bleeding: Secondary | ICD-10-CM | POA: Diagnosis present

## 2019-10-09 DIAGNOSIS — K449 Diaphragmatic hernia without obstruction or gangrene: Secondary | ICD-10-CM | POA: Diagnosis present

## 2019-10-09 DIAGNOSIS — Z86711 Personal history of pulmonary embolism: Secondary | ICD-10-CM | POA: Diagnosis not present

## 2019-10-09 DIAGNOSIS — E860 Dehydration: Secondary | ICD-10-CM | POA: Diagnosis not present

## 2019-10-09 DIAGNOSIS — F329 Major depressive disorder, single episode, unspecified: Secondary | ICD-10-CM | POA: Diagnosis present

## 2019-10-09 DIAGNOSIS — G8929 Other chronic pain: Secondary | ICD-10-CM | POA: Diagnosis present

## 2019-10-09 DIAGNOSIS — F419 Anxiety disorder, unspecified: Secondary | ICD-10-CM | POA: Diagnosis present

## 2019-10-09 DIAGNOSIS — I129 Hypertensive chronic kidney disease with stage 1 through stage 4 chronic kidney disease, or unspecified chronic kidney disease: Secondary | ICD-10-CM | POA: Diagnosis present

## 2019-10-09 LAB — COMPREHENSIVE METABOLIC PANEL
ALT: 17 U/L (ref 0–44)
AST: 26 U/L (ref 15–41)
Albumin: 3.1 g/dL — ABNORMAL LOW (ref 3.5–5.0)
Alkaline Phosphatase: 39 U/L (ref 38–126)
Anion gap: 10 (ref 5–15)
BUN: 27 mg/dL — ABNORMAL HIGH (ref 8–23)
CO2: 21 mmol/L — ABNORMAL LOW (ref 22–32)
Calcium: 8.4 mg/dL — ABNORMAL LOW (ref 8.9–10.3)
Chloride: 109 mmol/L (ref 98–111)
Creatinine, Ser: 1.46 mg/dL — ABNORMAL HIGH (ref 0.44–1.00)
GFR calc Af Amer: 37 mL/min — ABNORMAL LOW (ref >60)
GFR calc non Af Amer: 32 mL/min — ABNORMAL LOW (ref >60)
Glucose, Bld: 98 mg/dL (ref 70–99)
Potassium: 3.8 mmol/L (ref 3.5–5.1)
Sodium: 140 mmol/L (ref 135–145)
Total Bilirubin: 0.6 mg/dL (ref 0.3–1.2)
Total Protein: 6.4 g/dL — ABNORMAL LOW (ref 6.5–8.1)

## 2019-10-09 LAB — CBC WITH DIFFERENTIAL/PLATELET
Abs Immature Granulocytes: 0.03 10*3/uL (ref 0.00–0.07)
Basophils Absolute: 0 10*3/uL (ref 0.0–0.1)
Basophils Relative: 0 %
Eosinophils Absolute: 0 10*3/uL (ref 0.0–0.5)
Eosinophils Relative: 0 %
HCT: 32.6 % — ABNORMAL LOW (ref 36.0–46.0)
Hemoglobin: 10.2 g/dL — ABNORMAL LOW (ref 12.0–15.0)
Immature Granulocytes: 1 %
Lymphocytes Relative: 22 %
Lymphs Abs: 1 10*3/uL (ref 0.7–4.0)
MCH: 32.9 pg (ref 26.0–34.0)
MCHC: 31.3 g/dL (ref 30.0–36.0)
MCV: 105.2 fL — ABNORMAL HIGH (ref 80.0–100.0)
Monocytes Absolute: 0.3 10*3/uL (ref 0.1–1.0)
Monocytes Relative: 6 %
Neutro Abs: 3.2 10*3/uL (ref 1.7–7.7)
Neutrophils Relative %: 71 %
Platelets: 192 10*3/uL (ref 150–400)
RBC: 3.1 MIL/uL — ABNORMAL LOW (ref 3.87–5.11)
RDW: 12.4 % (ref 11.5–15.5)
WBC: 4.5 10*3/uL (ref 4.0–10.5)
nRBC: 0 % (ref 0.0–0.2)

## 2019-10-09 LAB — D-DIMER, QUANTITATIVE: D-Dimer, Quant: 1.82 ug/mL-FEU — ABNORMAL HIGH (ref 0.00–0.50)

## 2019-10-09 LAB — FERRITIN: Ferritin: 308 ng/mL — ABNORMAL HIGH (ref 11–307)

## 2019-10-09 LAB — URINE CULTURE
Culture: NO GROWTH
Special Requests: NORMAL

## 2019-10-09 LAB — PROCALCITONIN: Procalcitonin: <0.1 ng/mL

## 2019-10-09 LAB — MAGNESIUM: Magnesium: 1.9 mg/dL (ref 1.7–2.4)

## 2019-10-09 LAB — C-REACTIVE PROTEIN: CRP: 8.2 mg/dL — ABNORMAL HIGH (ref <1.0)

## 2019-10-09 MED ORDER — LACTATED RINGERS IV SOLN: INTRAVENOUS | Status: AC

## 2019-10-09 MED ORDER — SODIUM CHLORIDE 0.9 % IV SOLN
200.0000 mg | Freq: Once | INTRAVENOUS | Status: AC
  Administered 2019-10-09: 12:00:00 200 mg via INTRAVENOUS
  Filled 2019-10-09: qty 200

## 2019-10-09 MED ORDER — SODIUM CHLORIDE 0.9 % IV SOLN
100.0000 mg | Freq: Every day | INTRAVENOUS | Status: AC
  Administered 2019-10-10 – 2019-10-13 (×4): 100 mg via INTRAVENOUS
  Filled 2019-10-09 (×4): qty 20

## 2019-10-09 MED ORDER — LORAZEPAM 2 MG/ML IJ SOLN
1.0000 mg | Freq: Once | INTRAMUSCULAR | Status: AC
  Administered 2019-10-09: 1 mg via INTRAVENOUS
  Filled 2019-10-09: qty 1

## 2019-10-09 NOTE — Plan of Care (Signed)
  Problem: Education: Goal: Knowledge of General Education information will improve Description: Including pain rating scale, medication(s)/side effects and non-pharmacologic comfort measures 10/09/2019 1540 by Zadie Rhine, RN Outcome: Progressing 10/09/2019 1540 by Zadie Rhine, RN Outcome: Progressing

## 2019-10-09 NOTE — Progress Notes (Addendum)
PROGRESS NOTE    Brittney Hicks  [id number] DOB: Oct 18, 1928 DOA: 10/08/2019 PCP: [name], MD    Brief Narrative:  86 year old Caucasian female with PMH of CKD stage III, frequent UTIs on suppressive antibiotics, anxiety, hypertension who tested positive for COVID-19 at an urgent care on 10/06/2019 presented to the ED from home with progressive pathology and fatigue.  At baseline she is independent with ADLs.  While in the ED she was saturating well on room air, otherwise hemodynamically stable.  Chest x-ray normal, procalcitonin negative.  Found to have elevated creatinine of 1.6 with no known prior kidney disease.  Admitted for severe generalized weakness, AKI secondary to COVID-19.   Assessment & Plan:   Principal Problem:   COVID-19 virus infection Active Problems:   Anxiety   Essential hypertension   CKD (chronic kidney disease), stage III   Lower urinary tract infectious disease   Positive D dimer  COVID-19 infection Has a dry cough, generalized weakness, poor appetite but saturating well on room air.  Febrile overnight. Started on remdesivir today.  Given no hypoxia, will hold off on dexamethasone. Bilateral lower extremity ultrasound showed no DVT. Trend inflammatory markers PT/OT when able  Acute kidney injury on CKD stage IIIa Per daughter patient does not have known kidney disease but noted to have CKD stage III under care everywhere.  Last creatinine 03/22/2019 (under Care Everywhere) 1.36.  Baseline appears to be 1.2-1.3. Creatinine elevated to 1.6 on admission.  Likely prerenal in setting of poor p.o. intake.  Improved some with IV fluids. Continue gentle hydration.  Avoid nephrotoxic drugs.  Renally dose all medications.  Hypertension Stable.  Not on medications.  GERD PPI twice daily  Recurrent UTIs On suppressive antibiotic.  UA negative on admission Continue home Keflex  DVT prophylaxis: Heparin SQ Code Status: DNR  Family Communication: Updated  patient's daughter [name] via phone Disposition Plan: Patient would like to go home when medically ready   Consultants:   None  Procedures:  None  Antimicrobials:   Remdesivir   Subjective: Febrile overnight to 101.  Continues to feel fatigued.  Has a dry cough.  Denies dyspnea.  Objective: Vitals:   10/08/19 2213 10/09/19 0028 10/09/19 0300 10/09/19 0445  BP: (!) 109/58   (!) 119/57  Pulse: (!) 105 93 87 90  Resp: 20 18  18   Temp: (!) 101.5 F (38.6 C)  99.3 F (37.4 C) 100.1 F (37.8 C)  TempSrc: Oral  Oral Oral  SpO2: 95%   96%  Weight:      Height:        Intake/Output Summary (Last 24 hours) at 10/09/2019 0803 Last data filed at 10/09/2019 0500 Gross per 24 hour  Intake 905 ml  Output 200 ml  Net 705 ml   Filed Weights   10/08/19 2200  Weight: 57.7 kg    Examination:  General exam: Appears calm and comfortable.  Elderly tired appearing female Respiratory system: Clear to auscultation. Respiratory effort normal. Cardiovascular system: S1 & S2 heard, RRR. No JVD, murmurs. No pedal edema. Gastrointestinal system: Abdomen is nondistended, soft and nontender. Normal bowel sounds heard. Central nervous system: Alert and oriented. No obvious focal neurological deficits. Extremities: Moves all extremities voluntarily Skin: No rashes, lesions or ulcers Psychiatry: Judgement and insight appear normal. Mood & affect appropriate.     Data Reviewed: I have personally reviewed following labs and imaging studies  CBC: Recent Labs  Lab 10/08/19 0040 10/09/19 0348  WBC 3.7* 4.5  NEUTROABS 2.6 3.2  HGB 10.5* 10.2*  HCT 32.2* 32.6*  MCV 105.6* 105.2*  PLT 180 [id number]   Basic Metabolic Panel: Recent Labs  Lab 10/08/19 0040 10/09/19 0348  NA 138 140  K 3.8 3.8  CL 108 109  CO2 19* 21*  GLUCOSE 119* 98  BUN 35* 27*  CREATININE 1.60* 1.46*  CALCIUM 8.3* 8.4*  MG  --  1.9   GFR: Estimated Creatinine Clearance: 21.1 mL/min (A) (by C-G formula based  on SCr of 1.46 mg/dL (H)). Liver Function Tests: Recent Labs  Lab 10/08/19 0040 10/09/19 0348  AST 24 26  ALT 19 17  ALKPHOS 44 39  BILITOT 0.3 0.6  PROT 6.5 6.4*  ALBUMIN 3.3* 3.1*   No results for input(s): LIPASE, AMYLASE in the last 168 hours. No results for input(s): AMMONIA in the last 168 hours. Coagulation Profile: No results for input(s): INR, PROTIME in the last 168 hours. Cardiac Enzymes: Recent Labs  Lab 10/08/19 0440  CKTOTAL 184   BNP (last 3 results) No results for input(s): PROBNP in the last 8760 hours. HbA1C: No results for input(s): HGBA1C in the last 72 hours. CBG: No results for input(s): GLUCAP in the last 168 hours. Lipid Profile: Recent Labs    10/08/19 0040  TRIG 268*   Thyroid Function Tests: Recent Labs    10/08/19 0440  TSH 0.595   Anemia Panel: Recent Labs    10/08/19 0040 10/08/19 0440 10/09/19 0348  VITAMINB12  --  1,146*  --   FERRITIN 298  --  308*   Sepsis Labs: Recent Labs  Lab 10/08/19 0040  PROCALCITON 0.11  LATICACIDVEN 0.6    Recent Results (from the past 240 hour(s))  SARS CORONAVIRUS 2 (TAT 6-24 HRS) Nasopharyngeal Nasopharyngeal Swab     Status: Abnormal   Collection Time: 10/08/19 12:40 AM   Specimen: Nasopharyngeal Swab  Result Value Ref Range Status   SARS Coronavirus 2 POSITIVE (A) NEGATIVE Final    Comment: RESULT CALLED TO, READ BACK BY AND VERIFIED WITH: [place] GARRISON RN.@1051  ON 1.17.21 BY TCALDWELL MT. (NOTE) SARS-CoV-2 target nucleic acids are DETECTED. The SARS-CoV-2 RNA is generally detectable in upper and lower respiratory specimens during the acute phase of infection. Positive results are indicative of the presence of SARS-CoV-2 RNA. Clinical correlation with patient history and other diagnostic information is  necessary to determine patient infection status. Positive results do not rule out bacterial infection or co-infection with other viruses.  The expected result is Negative. Fact  Sheet for Patients: SugarRoll.be Fact Sheet for Healthcare Providers: https://www.woods-mathews.com/ This test is not yet approved or cleared by the [place] FDA and  has been authorized for detection and/or diagnosis of SARS-CoV-2 by FDA under an Emergency Use Authorization (EUA). This EUA will remain  in effect (meaning this test ca n be used) for the duration of the COVID-19 declaration under Section 564(b)(1) of the Act, 21 U.S.C. section 360bbb-3(b)(1), unless the authorization is terminated or revoked sooner. Performed at [facility], [place] [address], [place], [place] [address]   Blood Culture (routine x 2)     Status: None (Preliminary result)   Collection Time: 10/08/19  1:07 AM   Specimen: BLOOD  Result Value Ref Range Status   Specimen Description   Final    BLOOD LEFT ANTECUBITAL Performed at [facility], [place] [address], [place], [place] [address]    Special Requests   Final    BOTTLES DRAWN AEROBIC AND ANAEROBIC  Blood Culture results may not be optimal due to an excessive volume of blood received in culture bottles Performed at [facility] [address] [place], [place] [address]    Culture PENDING  Incomplete   Report Status PENDING  Incomplete         Radiology Studies: DG Chest Port 1 View  Result Date: 10/08/2019 CLINICAL DATA:  Shortness of breath. COVID positive. EXAM: PORTABLE CHEST 1 VIEW COMPARISON:  Radiograph 01/17/2018, CT 10/27/2014 FINDINGS: Heart is normal in size. Retrocardiac hiatal hernia. No focal airspace disease. No pleural fluid, pulmonary edema or pneumothorax. Bones are diffusely under mineralized. IMPRESSION: No evidence of pneumonia.  Hiatal hernia. Electronically Signed   By: Keith Rake M.D.   On: 10/08/2019 01:24   VAS [place] LOWER EXTREMITY VENOUS (DVT)  Result Date: 10/08/2019  Lower Venous Study Indications: Covid-19, elevated D-Dimer.  Comparison  Study: No prior study Performing Technologist: Sharion Dove RVS  Examination Guidelines: A complete evaluation includes B-mode imaging, spectral Doppler, color Doppler, and power Doppler as needed of all accessible portions of each vessel. Bilateral testing is considered an integral part of a complete examination. Limited examinations for reoccurring indications may be performed as noted.  +---------+---------------+---------+-----------+----------+--------------+ RIGHT    CompressibilityPhasicitySpontaneityPropertiesThrombus Aging +---------+---------------+---------+-----------+----------+--------------+ CFV      Full           Yes      Yes                                 +---------+---------------+---------+-----------+----------+--------------+ SFJ      Full                                                        +---------+---------------+---------+-----------+----------+--------------+ FV Prox  Full                                                        +---------+---------------+---------+-----------+----------+--------------+ FV Mid   Full                                                        +---------+---------------+---------+-----------+----------+--------------+ FV DistalFull                                                        +---------+---------------+---------+-----------+----------+--------------+ PFV      Full                                                        +---------+---------------+---------+-----------+----------+--------------+ POP      Full           Yes  Yes                                 +---------+---------------+---------+-----------+----------+--------------+ PTV      Full                                                        +---------+---------------+---------+-----------+----------+--------------+ PERO     Full                                                         +---------+---------------+---------+-----------+----------+--------------+   +---------+---------------+---------+-----------+----------+--------------+ LEFT     CompressibilityPhasicitySpontaneityPropertiesThrombus Aging +---------+---------------+---------+-----------+----------+--------------+ CFV      Full           Yes      Yes                                 +---------+---------------+---------+-----------+----------+--------------+ SFJ      Full                                                        +---------+---------------+---------+-----------+----------+--------------+ FV Prox  Full                                                        +---------+---------------+---------+-----------+----------+--------------+ FV Mid   Full                                                        +---------+---------------+---------+-----------+----------+--------------+ FV DistalFull                                                        +---------+---------------+---------+-----------+----------+--------------+ PFV      Full                                                        +---------+---------------+---------+-----------+----------+--------------+ POP      Full           Yes      Yes                                 +---------+---------------+---------+-----------+----------+--------------+ PTV      Full                                                        +---------+---------------+---------+-----------+----------+--------------+  PERO     Full                                                        +---------+---------------+---------+-----------+----------+--------------+     Summary: Right: There is no evidence of deep vein thrombosis in the lower extremity. Left: There is no evidence of deep vein thrombosis in the lower extremity.  *See table(s) above for measurements and observations. Electronically signed by Monica Martinez MD on  10/08/2019 at 3:57:24 PM.    Final         Scheduled Meds: . cephALEXin  500 mg Oral BID  . heparin  5,000 Units Subcutaneous Q8H  . mouth rinse  15 mL Mouth Rinse BID  . pantoprazole  40 mg Oral BID  . sodium chloride flush  3 mL Intravenous Q12H   Continuous Infusions:   LOS: 0 days    Time spent: Spent more than 30 minutes in coordinating care for this patient including bedside patient care.   Lucky Cowboy, MD [facility] If 7PM-7AM, please contact night-coverage 10/09/2019, 8:03 AM

## 2019-10-09 NOTE — Progress Notes (Signed)
   10/09/19 2206  Vitals  Temp (!) 102.7 F (39.3 C)  Temp Source Oral  BP (!) 116/58  MAP (mmHg) 75  BP Method Automatic  Pulse Rate (!) 109  Pulse Rate Source Monitor  Resp 20  Oxygen Therapy  SpO2 94 %  O2 Device Room Air  MEWS Score  MEWS Temp 2  MEWS Systolic 0  MEWS Pulse 1  MEWS RR 0  MEWS LOC 0  MEWS Score 3  MEWS Score Color Yellow  MEWS Assessment  Is this an acute change? Yes  MEWS guidelines implemented *See Row Information* Yellow  Provider Notification  Provider Name/Title M.Sharlet Salina NP  Date Provider Notified 10/09/19  Time Provider Notified 2213  Notification Type Page  Notification Reason Other (Comment) (Yellow [place])  Response Other (Comment) (To give Tylenol)  Date of Provider Response 10/09/19  Time of Provider Response 2215

## 2019-10-09 NOTE — Evaluation (Addendum)
Physical Therapy Evaluation Patient Details Name: Brittney Hicks MRN: [id number] DOB: Feb 03, 1929 Today's Date: 10/09/2019   History of Present Illness  86 year old Caucasian female with PMH of CKD stage III, frequent UTIs on suppressive antibiotics, anxiety, hypertension who tested positive for COVID-19 at an urgent care on 10/06/2019 presented to the ED from home with progressive pathology and fatigue.  At baseline she is independent with ADLs.  While in the ED she was saturating well on room air, otherwise hemodynamically stable.  Chest x-ray normal, procalcitonin negative.  Found to have elevated creatinine of 1.6 with no known prior kidney disease.  Admitted for severe generalized weakness, AKI secondary to COVID-19.    Clinical Impression  [name] is 86 y.o. female admitted with above HPI and diagnosis. Patient is currently limited by functional impairments below (see PT problem list). Patient lives with her daughter and per pt and chart review she was modified independent with RW for mobilty prior to gradual weakness. Patient will benefit from continued skilled PT interventions to address impairments and progress independence with mobility, recommending SNF and 24/7 assist. Acute PT will follow and progress as able.     Follow Up Recommendations SNF    Equipment Recommendations  Other (comment)(tbd)    Recommendations for Other Services       Precautions / Restrictions Precautions Precautions: Fall Restrictions Weight Bearing Restrictions: No      Mobility  Bed Mobility Overal bed mobility: Needs Assistance Bed Mobility: Sit to Supine;Supine to Sit     Supine to sit: HOB elevated;Mod assist Sit to supine: HOB elevated;Mod assist   General bed mobility comments: cues for walking LE's to EOB and assist. cues for reaching Rt UE to bed rail to initiate roll and assist to complete lower turnk roll and to raise trunk upright. pt limited by nausea and emesis in which she  brought up clear saliva. pt abel to move  LE's in bed to reposition but required mod assist to readjsut trunk, and +2 to boost in bed.  Transfers          General transfer comment: unable to perform due to n/v  Ambulation/Gait       Stairs     Wheelchair Mobility    Modified Rankin (Stroke Patients Only)       Balance Overall balance assessment: Needs assistance   Sitting balance-Leahy Scale: Poor            Pertinent Vitals/Pain Pain Assessment: Faces Faces Pain Scale: Hurts a little bit Pain Location: generalized Pain Descriptors / Indicators: Grimacing Pain Intervention(s): Limited activity within patient's tolerance;Monitored during session(pt more limited by nausea and emesis)    Home Living Family/patient expects to be discharged to:: Skilled nursing facility Living Arrangements: Children(pt lives with her daughter) Available Help at Discharge: Family;Available 24 hours/day Type of Home: House Home Access: Ramped entrance     Home Layout: One level Home Equipment: Walker - 2 wheels      Prior Function Level of Independence: Independent with assistive device(s);Needs assistance         Comments: pt reports she was ambulating with RW in her home, uncertain how much assist pt requried from daughter for ADL's     Hand Dominance        Extremity/Trunk Assessment   Upper Extremity Assessment Upper Extremity Assessment: Generalized weakness    Lower Extremity Assessment Lower Extremity Assessment: Generalized weakness    Cervical / Trunk Assessment Cervical / Trunk Assessment: Kyphotic  Communication  Cognition Arousal/Alertness: Awake/alert Behavior During Therapy: Anxious Overall Cognitive Status: No family/caregiver present to determine baseline cognitive functioning          General Comments: pt oriented to self place and situation      General Comments      Exercises     Assessment/Plan    PT Assessment Patient  needs continued PT services  PT Problem List Decreased strength;Decreased balance;Decreased mobility;Decreased range of motion;Decreased activity tolerance;Decreased knowledge of use of DME       PT Treatment Interventions DME instruction;Functional mobility training;Therapeutic activities;Gait training;Stair training;Therapeutic exercise;Balance training;Patient/family education    PT Goals (Current goals can be found in the Care Plan section)  Acute Rehab PT Goals Patient Stated Goal: improve independence, stop feeling nauseous PT Goal Formulation: With patient Time For Goal Achievement: 10/23/19 Potential to Achieve Goals: Fair    Frequency 2X/week    AM-PAC PT "6 Clicks" Mobility  Outcome Measure Help needed turning from your back to your side while in a flat bed without using bedrails?: A Little Help needed moving from lying on your back to sitting on the side of a flat bed without using bedrails?: A Little Help needed moving to and from a bed to a chair (including a wheelchair)?: A Little Help needed standing up from a chair using your arms (e.g., wheelchair or bedside chair)?: A Little Help needed to walk in hospital room?: A Little Help needed climbing 3-5 steps with a railing? : A Little 6 Click Score: 18    End of Session Equipment Utilized During Treatment: Gait belt Activity Tolerance: Patient tolerated treatment well Patient left: with call bell/phone within reach;in bed;with nursing/sitter in room Nurse Communication: Mobility status PT Visit Diagnosis: Muscle weakness (generalized) (M62.81);Difficulty in walking, not elsewhere classified (R26.2)    Time: [id number] PT Time Calculation (min) (ACUTE ONLY): 30 min   Charges:   PT Evaluation $PT Eval Moderate Complexity: 1 Mod          Gwynneth Albright PT, DPT Physical Therapist with [facility] [phone]  10/09/2019 7:44 PM

## 2019-10-09 NOTE — Progress Notes (Signed)
Patient is mildly anxious. Notified the PCP for an order.

## 2019-10-09 NOTE — Progress Notes (Signed)
Responded to spiritual care consult. Pt requested prayer. I called her and no answer. Will follow up at later time.   Chaplain Resident Fidel Levy MA [phone]

## 2019-10-09 NOTE — Progress Notes (Signed)
   10/08/19 2213  MEWS Score  Temp (!) 101.5 F (38.6 C)  BP (!) 109/58  Pulse Rate (!) 105  Resp 20  Level of Consciousness Alert  SpO2 95 %  O2 Device Room Air   Tylenol given, Will recheck temp @ 0300 No c/o pain/discomfort, A&Ox4

## 2019-10-10 LAB — COMPREHENSIVE METABOLIC PANEL
ALT: 16 U/L (ref 0–44)
AST: 26 U/L (ref 15–41)
Albumin: 2.8 g/dL — ABNORMAL LOW (ref 3.5–5.0)
Alkaline Phosphatase: 37 U/L — ABNORMAL LOW (ref 38–126)
Anion gap: 9 (ref 5–15)
BUN: 28 mg/dL — ABNORMAL HIGH (ref 8–23)
CO2: 20 mmol/L — ABNORMAL LOW (ref 22–32)
Calcium: 7.9 mg/dL — ABNORMAL LOW (ref 8.9–10.3)
Chloride: 109 mmol/L (ref 98–111)
Creatinine, Ser: 1.3 mg/dL — ABNORMAL HIGH (ref 0.44–1.00)
GFR calc Af Amer: 42 mL/min — ABNORMAL LOW (ref >60)
GFR calc non Af Amer: 37 mL/min — ABNORMAL LOW (ref >60)
Glucose, Bld: 97 mg/dL (ref 70–99)
Potassium: 3.4 mmol/L — ABNORMAL LOW (ref 3.5–5.1)
Sodium: 138 mmol/L (ref 135–145)
Total Bilirubin: 0.4 mg/dL (ref 0.3–1.2)
Total Protein: 5.9 g/dL — ABNORMAL LOW (ref 6.5–8.1)

## 2019-10-10 LAB — CBC WITH DIFFERENTIAL/PLATELET
Abs Immature Granulocytes: 0.03 10*3/uL (ref 0.00–0.07)
Basophils Absolute: 0 10*3/uL (ref 0.0–0.1)
Basophils Relative: 0 %
Eosinophils Absolute: 0 10*3/uL (ref 0.0–0.5)
Eosinophils Relative: 0 %
HCT: 29.5 % — ABNORMAL LOW (ref 36.0–46.0)
Hemoglobin: 9.5 g/dL — ABNORMAL LOW (ref 12.0–15.0)
Immature Granulocytes: 1 %
Lymphocytes Relative: 21 %
Lymphs Abs: 1 10*3/uL (ref 0.7–4.0)
MCH: 33.7 pg (ref 26.0–34.0)
MCHC: 32.2 g/dL (ref 30.0–36.0)
MCV: 104.6 fL — ABNORMAL HIGH (ref 80.0–100.0)
Monocytes Absolute: 0.2 10*3/uL (ref 0.1–1.0)
Monocytes Relative: 5 %
Neutro Abs: 3.4 10*3/uL (ref 1.7–7.7)
Neutrophils Relative %: 73 %
Platelets: 193 10*3/uL (ref 150–400)
RBC: 2.82 MIL/uL — ABNORMAL LOW (ref 3.87–5.11)
RDW: 12.4 % (ref 11.5–15.5)
WBC: 4.6 10*3/uL (ref 4.0–10.5)
nRBC: 0 % (ref 0.0–0.2)

## 2019-10-10 LAB — FOLATE RBC
Folate, Hemolysate: 303 ng/mL
Folate, RBC: 974 ng/mL (ref >498)
Hematocrit: 31.1 % — ABNORMAL LOW (ref 34.0–46.6)

## 2019-10-10 LAB — FERRITIN: Ferritin: 342 ng/mL — ABNORMAL HIGH (ref 11–307)

## 2019-10-10 LAB — C-REACTIVE PROTEIN: CRP: 10.6 mg/dL — ABNORMAL HIGH (ref <1.0)

## 2019-10-10 LAB — D-DIMER, QUANTITATIVE: D-Dimer, Quant: 1.46 ug/mL-FEU — ABNORMAL HIGH (ref 0.00–0.50)

## 2019-10-10 MED ORDER — LORAZEPAM 2 MG/ML IJ SOLN
1.0000 mg | Freq: Once | INTRAMUSCULAR | Status: AC
  Administered 2019-10-10: 23:00:00 1 mg via INTRAVENOUS
  Filled 2019-10-10: qty 1

## 2019-10-10 MED ORDER — DEXAMETHASONE SODIUM PHOSPHATE 10 MG/ML IJ SOLN
6.0000 mg | INTRAMUSCULAR | Status: DC
  Administered 2019-10-10 – 2019-10-13 (×4): 6 mg via INTRAVENOUS
  Filled 2019-10-10 (×5): qty 1

## 2019-10-10 MED ORDER — SODIUM CHLORIDE 0.9 % IV SOLN: INTRAVENOUS | Status: AC

## 2019-10-10 MED ORDER — POTASSIUM CHLORIDE 20 MEQ PO PACK
40.0000 meq | PACK | Freq: Once | ORAL | Status: AC
  Administered 2019-10-10: 40 meq via ORAL
  Filled 2019-10-10: qty 2

## 2019-10-10 NOTE — Progress Notes (Signed)
Patient states that she is anxious. PCP was notified for a possible order.

## 2019-10-10 NOTE — Progress Notes (Signed)
PROGRESS NOTE    Brittney Hicks  [id number] DOB: August 02, 1929 DOA: 10/08/2019 PCP: [name], MD    Brief Narrative:  86 year old Caucasian female with PMH of CKD stage III, frequent UTIs on suppressive antibiotics, anxiety, hypertension who tested positive for COVID-19 at an urgent care on 10/06/2019 presented to the ED from home with progressive pathology and fatigue.  At baseline she is independent with ADLs.  While in the ED she was saturating well on room air, otherwise hemodynamically stable.  Chest x-ray normal, procalcitonin negative.  Found to have elevated creatinine of 1.6.  Admitted for severe generalized weakness, AKI secondary to COVID-19.   Assessment & Plan:   Principal Problem:   COVID-19 virus infection Active Problems:   Anxiety   Essential hypertension   CKD (chronic kidney disease), stage III   Lower urinary tract infectious disease   Positive D dimer   COVID-19  COVID-19 infection Has a dry cough, generalized weakness, poor appetite but saturating well on room air.  Continues to have persistent fever spikes.  CRP uptrending. Continue remdesivir, will add dexamethasone Bilateral lower extremity ultrasound showed no DVT. Trend inflammatory markers PT/OT   Fever Most likely in setting of COVID-19 infection.  Blood culture 10/08/2019 negative to date.  Urine culture 10/08/2019 negative.  No leukocytosis.  Procalcitonin negative.  Do not suspect a bacterial super infection at this time.  Bilateral lower extremity ultrasound negative for DVTs.  Will hold off on further work-up at this time.  Acute kidney injury on CKD stage IIIa Per daughter patient does not have known kidney disease but noted to have CKD stage III under care everywhere.  Last creatinine 03/22/2019 (under Care Everywhere) 1.36.  Baseline appears to be 1.2-1.3. Creatinine elevated to 1.6 on admission.  Likely prerenal in setting of poor p.o. intake.  Improved with IV fluids.  Appears to be at  baseline now. Avoid nephrotoxic drugs.  Renally dose all medications.  Hypertension Stable.  Not on medications.  GERD PPI twice daily  Recurrent UTIs On suppressive antibiotic.  UA negative on admission Continue home Keflex  DVT prophylaxis: Heparin SQ Code Status: DNR  Family Communication: Updated patient's daughter [name] via phone Disposition Plan: Patient would like to go home when medically ready.  PT/OT recommended SNF.  Patient's daughter [name] notes they promised their father that they would not put her in a nursing home but she will talk to her sisters.   Consultants:   None  Procedures:  None  Antimicrobials:   Remdesivir   Subjective: Febrile this morning to 101.  Continues to feel fatigued.  Has a dry cough.  Denies dyspnea.  Objective: Vitals:   10/09/19 1211 10/09/19 2206 10/10/19 0006 10/10/19 0406  BP: (!) 113/59 (!) 116/58 125/63 104/87  Pulse: 99 (!) 109 97 86  Resp: 20 20 18 20   Temp: 98.7 F (37.1 C) (!) 102.7 F (39.3 C) 99.1 F (37.3 C) 99.4 F (37.4 C)  TempSrc: Oral Oral Oral Oral  SpO2: 95% 94% 93% 95%  Weight:      Height:        Intake/Output Summary (Last 24 hours) at 10/10/2019 0749 Last data filed at 10/10/2019 0434 Gross per 24 hour  Intake 2547.64 ml  Output 1500 ml  Net 1047.64 ml   Filed Weights   10/08/19 2200  Weight: 57.7 kg    Examination:  General exam: Appears calm and comfortable.  Elderly tired appearing female Respiratory system: Clear to auscultation. Respiratory effort  normal. Cardiovascular system: S1 & S2 heard, RRR. No JVD, murmurs. No pedal edema. Gastrointestinal system: Abdomen is nondistended, soft and nontender. Normal bowel sounds heard. Central nervous system: Alert and oriented. No obvious focal neurological deficits. Extremities: Moves all extremities voluntarily Skin: No rashes, lesions or ulcers Psychiatry: Judgement and insight appear normal. Mood & affect appropriate.    Data Reviewed: I have personally reviewed following labs and imaging studies  CBC: Recent Labs  Lab 10/08/19 0040 10/09/19 0348 10/10/19 0319  WBC 3.7* 4.5 4.6  NEUTROABS 2.6 3.2 3.4  HGB 10.5* 10.2* 9.5*  HCT 32.2* 32.6* 29.5*  MCV 105.6* 105.2* 104.6*  PLT 180 192 0000000   Basic Metabolic Panel: Recent Labs  Lab 10/08/19 0040 10/09/19 0348 10/10/19 0319  NA 138 140 138  K 3.8 3.8 3.4*  CL 108 109 109  CO2 19* 21* 20*  GLUCOSE 119* 98 97  BUN 35* 27* 28*  CREATININE 1.60* 1.46* 1.30*  CALCIUM 8.3* 8.4* 7.9*  MG  --  1.9  --    GFR: Estimated Creatinine Clearance: 23.7 mL/min (A) (by C-G formula based on SCr of 1.3 mg/dL (H)). Liver Function Tests: Recent Labs  Lab 10/08/19 0040 10/09/19 0348 10/10/19 0319  AST 24 26 26   ALT 19 17 16   ALKPHOS 44 39 37*  BILITOT 0.3 0.6 0.4  PROT 6.5 6.4* 5.9*  ALBUMIN 3.3* 3.1* 2.8*   No results for input(s): LIPASE, AMYLASE in the last 168 hours. No results for input(s): AMMONIA in the last 168 hours. Coagulation Profile: No results for input(s): INR, PROTIME in the last 168 hours. Cardiac Enzymes: Recent Labs  Lab 10/08/19 0440  CKTOTAL 184   BNP (last 3 results) No results for input(s): PROBNP in the last 8760 hours. HbA1C: No results for input(s): HGBA1C in the last 72 hours. CBG: No results for input(s): GLUCAP in the last 168 hours. Lipid Profile: Recent Labs    10/08/19 0040  TRIG 268*   Thyroid Function Tests: Recent Labs    10/08/19 0440  TSH 0.595   Anemia Panel: Recent Labs    10/08/19 0040 10/08/19 0440 10/09/19 0348 10/10/19 0319  VITAMINB12  --  1,146*  --   --   FERRITIN   < >  --  308* 342*   < > = values in this interval not displayed.   Sepsis Labs: Recent Labs  Lab 10/08/19 0040 10/09/19 0815  PROCALCITON 0.11 <0.10  LATICACIDVEN 0.6  --     Recent Results (from the past 240 hour(s))  SARS CORONAVIRUS 2 (TAT 6-24 HRS) Nasopharyngeal Nasopharyngeal Swab     Status:  Abnormal   Collection Time: 10/08/19 12:40 AM   Specimen: Nasopharyngeal Swab  Result Value Ref Range Status   SARS Coronavirus 2 POSITIVE (A) NEGATIVE Final    Comment: RESULT CALLED TO, READ BACK BY AND VERIFIED WITH: [place] GARRISON RN.@1051  ON 1.17.21 BY TCALDWELL MT. (NOTE) SARS-CoV-2 target nucleic acids are DETECTED. The SARS-CoV-2 RNA is generally detectable in upper and lower respiratory specimens during the acute phase of infection. Positive results are indicative of the presence of SARS-CoV-2 RNA. Clinical correlation with patient history and other diagnostic information is  necessary to determine patient infection status. Positive results do not rule out bacterial infection or co-infection with other viruses.  The expected result is Negative. Fact Sheet for Patients: SugarRoll.be Fact Sheet for Healthcare Providers: https://www.woods-mathews.com/ This test is not yet approved or cleared by the [place] and  has been authorized  for detection and/or diagnosis of SARS-CoV-2 by FDA under an Emergency Use Authorization (EUA). This EUA will remain  in effect (meaning this test ca n be used) for the duration of the COVID-19 declaration under Section 564(b)(1) of the Act, 21 U.S.C. section 360bbb-3(b)(1), unless the authorization is terminated or revoked sooner. Performed at [facility], [place] [address] [place], [place] [address]   Urine culture     Status: None   Collection Time: 10/08/19 12:42 AM   Specimen: Urine, Catheterized  Result Value Ref Range Status   Specimen Description   Final    URINE, CATHETERIZED Performed at [facility] [address], [place], [place] [address]    Special Requests   Final    Normal Performed at [facility], [place] [address], [place], [place] [address]    Culture   Final    NO GROWTH Performed at [facility], [place] [address]  [place], [place] [address]    Report Status 10/09/2019 FINAL  Final  Blood Culture (routine x 2)     Status: None (Preliminary result)   Collection Time: 10/08/19  1:07 AM   Specimen: BLOOD  Result Value Ref Range Status   Specimen Description   Final    BLOOD RIGHT WRIST Performed at [facility] [address] [place], [place] [address]    Special Requests   Final    BOTTLES DRAWN AEROBIC AND ANAEROBIC Blood Culture adequate volume Performed at [facility] [address], [place], [place] [address]    Culture   Final    NO GROWTH 2 DAYS Performed at [facility] [address] [place], [place] [address]    Report Status PENDING  Incomplete  Blood Culture (routine x 2)     Status: None (Preliminary result)   Collection Time: 10/08/19  1:07 AM   Specimen: BLOOD  Result Value Ref Range Status   Specimen Description   Final    BLOOD LEFT ANTECUBITAL Performed at [facility], [place] [address] [place], [place] [address]    Special Requests   Final    BOTTLES DRAWN AEROBIC AND ANAEROBIC Blood Culture results may not be optimal due to an excessive volume of blood received in culture bottles Performed at [facility] [address], [place], [place] [address]    Culture   Final    NO GROWTH 2 DAYS Performed at [facility] [address] [place], [place] [address]    Report Status PENDING  Incomplete         Radiology Studies: VAS [place] LOWER EXTREMITY VENOUS (DVT)  Result Date: 10/08/2019  Lower Venous Study Indications: Covid-19, elevated D-Dimer.  Comparison Study: No prior study Performing Technologist: Sharion Dove RVS  Examination Guidelines: A complete evaluation includes B-mode imaging, spectral Doppler, color Doppler, and power Doppler as needed of all accessible portions of each vessel. Bilateral testing is considered an integral part of a complete examination. Limited examinations for  reoccurring indications may be performed as noted.  +---------+---------------+---------+-----------+----------+--------------+ RIGHT    CompressibilityPhasicitySpontaneityPropertiesThrombus Aging +---------+---------------+---------+-----------+----------+--------------+ CFV      Full           Yes      Yes                                 +---------+---------------+---------+-----------+----------+--------------+ SFJ  Full                                                        +---------+---------------+---------+-----------+----------+--------------+ FV Prox  Full                                                        +---------+---------------+---------+-----------+----------+--------------+ FV Mid   Full                                                        +---------+---------------+---------+-----------+----------+--------------+ FV DistalFull                                                        +---------+---------------+---------+-----------+----------+--------------+ PFV      Full                                                        +---------+---------------+---------+-----------+----------+--------------+ POP      Full           Yes      Yes                                 +---------+---------------+---------+-----------+----------+--------------+ PTV      Full                                                        +---------+---------------+---------+-----------+----------+--------------+ PERO     Full                                                        +---------+---------------+---------+-----------+----------+--------------+   +---------+---------------+---------+-----------+----------+--------------+ LEFT     CompressibilityPhasicitySpontaneityPropertiesThrombus Aging +---------+---------------+---------+-----------+----------+--------------+ CFV      Full           Yes      Yes                                  +---------+---------------+---------+-----------+----------+--------------+ SFJ      Full                                                        +---------+---------------+---------+-----------+----------+--------------+  FV Prox  Full                                                        +---------+---------------+---------+-----------+----------+--------------+ FV Mid   Full                                                        +---------+---------------+---------+-----------+----------+--------------+ FV DistalFull                                                        +---------+---------------+---------+-----------+----------+--------------+ PFV      Full                                                        +---------+---------------+---------+-----------+----------+--------------+ POP      Full           Yes      Yes                                 +---------+---------------+---------+-----------+----------+--------------+ PTV      Full                                                        +---------+---------------+---------+-----------+----------+--------------+ PERO     Full                                                        +---------+---------------+---------+-----------+----------+--------------+     Summary: Right: There is no evidence of deep vein thrombosis in the lower extremity. Left: There is no evidence of deep vein thrombosis in the lower extremity.  *See table(s) above for measurements and observations. Electronically signed by Monica Martinez MD on 10/08/2019 at 3:57:24 PM.    Final         Scheduled Meds: . cephALEXin  500 mg Oral BID  . dexamethasone (DECADRON) injection  6 mg Intravenous Q24H  . heparin  5,000 Units Subcutaneous Q8H  . mouth rinse  15 mL Mouth Rinse BID  . pantoprazole  40 mg Oral BID  . potassium chloride  40 mEq Oral Once  . sodium chloride flush  3 mL Intravenous Q12H   Continuous  Infusions: . lactated ringers 75 mL/hr at 10/09/19 2244  . remdesivir 100 mg in NS 100 mL       LOS: 1 day    Time spent: Spent more than 30 minutes in coordinating care for this patient including bedside patient  care.   Lucky Cowboy, MD [facility] If 7PM-7AM, please contact night-coverage 10/10/2019, 7:49 AM

## 2019-10-10 NOTE — Plan of Care (Signed)

## 2019-10-10 NOTE — Progress Notes (Signed)
This chaplain followed up with Pt. request for prayer. The chaplain appreciates the support of the Pt. RN-Sophia about the best methods of communication with the Pt..  The chaplain attempted to phone the Pt. with no answer from the Pt.  The chaplain chose to share a quilted heart with the promise of prayer through the RN. F/U spiritual care is available as needed.

## 2019-10-10 NOTE — Progress Notes (Signed)
Physical Therapy Treatment Patient Details Name: Brittney Hicks MRN: [id number] DOB: 02-Nov-1928 Today's Date: 10/10/2019    History of Present Illness 86 year old Caucasian female with PMH of CKD stage III, frequent UTIs on suppressive antibiotics, anxiety, hypertension who tested positive for COVID-19 at an urgent care on 10/06/2019 presented to the ED from home with progressive pathology and fatigue.  At baseline she is independent with ADLs.  While in the ED she was saturating well on room air, otherwise hemodynamically stable.  Chest x-ray normal, procalcitonin negative.  Found to have elevated creatinine of 1.6 with no known prior kidney disease.  Admitted for severe generalized weakness, AKI secondary to COVID-19.    PT Comments    Pt making good progress today.  She was able to ambulate in room with RW with min guard.  Needs increased time and some cues for safety, but overall good improvements.  Her vitals were stable on RA.  Updated POC from SNF to home with 24 hr supervision and HHPT.      Follow Up Recommendations  Home health PT;Supervision/Assistance - 24 hour;Other (comment)(Reports has 24 hr supv/assist - if not then continue SNF)     Equipment Recommendations  None recommended by PT    Recommendations for Other Services       Precautions / Restrictions Precautions Precautions: Fall    Mobility  Bed Mobility Overal bed mobility: Needs Assistance Bed Mobility: Sit to Supine       Sit to supine: Supervision      Transfers Overall transfer level: Needs assistance Equipment used: Rolling walker (2 wheeled) Transfers: Sit to/from Stand Sit to Stand: Min guard         General transfer comment: Performed sit to stand x 3 with increased time and cues for hand placment  Ambulation/Gait Ambulation/Gait assistance: Min guard Gait Distance (Feet): 60 Feet Assistive device: Rolling walker (2 wheeled) Gait Pattern/deviations: Decreased stride length      General Gait Details: decreased gait speed; cues for RW in tight spaces with turns   Chief Strategy Officer    Modified Rankin (Stroke Patients Only)       Balance Overall balance assessment: Needs assistance Sitting-balance support: No upper extremity supported;Feet supported Sitting balance-Leahy Scale: Good     Standing balance support: Bilateral upper extremity supported;During functional activity Standing balance-Leahy Scale: Fair                              Cognition Arousal/Alertness: Awake/alert Behavior During Therapy: WFL for tasks assessed/performed Overall Cognitive Status: No family/caregiver present to determine baseline cognitive functioning                                 General Comments: HOH      Exercises      General Comments General comments (skin integrity, edema, etc.): On RA with O2 sats stable at 96%; some c/o dizziness and BP was 101/65      Pertinent Vitals/Pain Pain Assessment: No/denies pain    Home Living                      Prior Function            PT Goals (current goals can now be found in the care plan section) Progress towards PT goals: Progressing  toward goals    Frequency    Min 3X/week      PT Plan Discharge plan needs to be updated;Frequency needs to be updated    Co-evaluation              AM-PAC PT "6 Clicks" Mobility   Outcome Measure  Help needed turning from your back to your side while in a flat bed without using bedrails?: A Little Help needed moving from lying on your back to sitting on the side of a flat bed without using bedrails?: A Little Help needed moving to and from a bed to a chair (including a wheelchair)?: None Help needed standing up from a chair using your arms (e.g., wheelchair or bedside chair)?: None Help needed to walk in hospital room?: None Help needed climbing 3-5 steps with a railing? : A Little 6 Click Score:  21    End of Session Equipment Utilized During Treatment: Gait belt Activity Tolerance: Patient tolerated treatment well Patient left: with call bell/phone within reach;in bed;with nursing/sitter in room;with bed alarm set Nurse Communication: Mobility status PT Visit Diagnosis: Muscle weakness (generalized) (M62.81);Difficulty in walking, not elsewhere classified (R26.2)     Time: 1430-1456 PT Time Calculation (min) (ACUTE ONLY): 26 min  Charges:  $Gait Training: 8-22 mins $Therapeutic Activity: 8-22 mins                     Maggie Font, PT Acute Rehab Services Pager [phone] [facility] [phone] [facility] [place] 10/10/2019, 4:34 PM

## 2019-10-10 NOTE — Evaluation (Signed)
Occupational Therapy Evaluation Patient Details Name: Brittney Hicks MRN: [id number] DOB: 1929/02/26 Today's Date: 10/10/2019    History of Present Illness 86 year old Caucasian female with PMH of CKD stage III, frequent UTIs on suppressive antibiotics, anxiety, hypertension who tested positive for COVID-19 at an urgent care on 10/06/2019 presented to the ED from home with progressive pathology and fatigue.  At baseline she is independent with ADLs.  While in the ED she was saturating well on room air, otherwise hemodynamically stable.  Chest x-ray normal, procalcitonin negative.  Found to have elevated creatinine of 1.6 with no known prior kidney disease.  Admitted for severe generalized weakness, AKI secondary to COVID-19.   Clinical Impression   This 86 year old female was admitted for the above. At baseline, she lives with daughter but is independent with adls.  Pt presents with generalized weakness and she had a fever at time of evaluation. She needs min to mod A for most adls and performed a SPT with min A. Will follow in acute setting with supervision level goals     Follow Up Recommendations  SNF;Supervision/Assistance - 24 hour    Equipment Recommendations  3 in 1 bedside commode    Recommendations for Other Services       Precautions / Restrictions Precautions Precautions: Fall Restrictions Weight Bearing Restrictions: No      Mobility Bed Mobility         Supine to sit: Min assist;HOB elevated     General bed mobility comments: mod A to scoot to EOB after sitting up  Transfers Overall transfer level: Needs assistance Equipment used: Rolling walker (2 wheeled) Transfers: Sit to/from [facility] Sit to Stand: Min assist Stand pivot transfers: Min assist       General transfer comment: assist to power up and stabilize    Balance                                           ADL either performed or assessed with clinical  judgement   ADL Overall ADL's : Needs assistance/impaired Eating/Feeding: Independent   Grooming: Set up   Upper Body Bathing: Set up   Lower Body Bathing: Minimal assistance;Moderate assistance   Upper Body Dressing : Minimal assistance(lines)   Lower Body Dressing: Minimal assistance;Moderate assistance   Toilet Transfer: Minimal assistance;Stand-pivot(chair)   Toileting- Clothing Manipulation and Hygiene: Minimal assistance;Moderate assistance         General ADL Comments: pt able to cross legs for adls.  SPT to chair     Vision         Perception     Praxis      Pertinent Vitals/Pain Pain Assessment: No/denies pain     Hand Dominance     Extremity/Trunk Assessment Upper Extremity Assessment Upper Extremity Assessment: Generalized weakness           Communication Communication Communication: No difficulties   Cognition Arousal/Alertness: Awake/alert Behavior During Therapy: WFL for tasks assessed/performed Overall Cognitive Status: No family/caregiver present to determine baseline cognitive functioning                                 General Comments: followed all commands.  Reinforcement for incentive spirometer, which is new to her   General Comments       Exercises  Shoulder Instructions      Home Living Family/patient expects to be discharged to:: Skilled nursing facility Living Arrangements: Children Available Help at Discharge: Family;Available 24 hours/day                         Home Equipment: Walker - 2 wheels          Prior Functioning/Environment Level of Independence: Independent with assistive device(s);Needs assistance        Per chart, pt was independent with adls at baseline (using RW)        OT Problem List: Decreased strength;Decreased activity tolerance;Impaired balance (sitting and/or standing);Decreased knowledge of use of DME or AE      OT Treatment/Interventions: Self-care/ADL  training;DME and/or AE instruction;Energy conservation;Balance training;Patient/family education;Therapeutic activities    OT Goals(Current goals can be found in the care plan section) Acute Rehab OT Goals Patient Stated Goal: return to PLOF OT Goal Formulation: With patient Time For Goal Achievement: 10/24/19 Potential to Achieve Goals: Good ADL Goals Pt Will Transfer to Toilet: with supervision;ambulating;bedside commode Pt Will Perform Toileting - Clothing Manipulation and hygiene: with supervision;sit to/from stand Additional ADL Goal #1: pt will perform adl at supervision and set up level with rest breaks for energy conservation Additional ADL Goal #2: pt will perform bed mobility at a supervision level in preparation for adls  OT Frequency: Min 2X/week   Barriers to D/C:            Co-evaluation              AM-PAC OT "6 Clicks" Daily Activity     Outcome Measure Help from another person eating meals?: None Help from another person taking care of personal grooming?: A Little Help from another person toileting, which includes using toliet, bedpan, or urinal?: A Lot Help from another person bathing (including washing, rinsing, drying)?: A Lot Help from another person to put on and taking off regular upper body clothing?: A Little Help from another person to put on and taking off regular lower body clothing?: A Lot 6 Click Score: 16   End of Session    Activity Tolerance: Patient tolerated treatment well Patient left: in chair;with call bell/phone within reach;with chair alarm set  OT Visit Diagnosis: Muscle weakness (generalized) (M62.81);Unsteadiness on feet (R26.81)                Time: [id number] OT Time Calculation (min): 26 min Charges:  OT General Charges $OT Visit: 1 Visit OT Evaluation $OT Eval Low Complexity: 1 Low OT Treatments $Therapeutic Activity: 8-22 mins  Solmon Bohr S, OTR/L Acute Rehabilitation  Services 10/10/2019  [name] 10/10/2019, 12:49 PM

## 2019-10-10 NOTE — Progress Notes (Addendum)
Pt elevated temp 101.2, Tylendol and IS given, will cont to monitor. MD made aware. SRP, RN

## 2019-10-11 LAB — CBC WITH DIFFERENTIAL/PLATELET
Abs Immature Granulocytes: 0.03 10*3/uL (ref 0.00–0.07)
Basophils Absolute: 0 10*3/uL (ref 0.0–0.1)
Basophils Relative: 0 %
Eosinophils Absolute: 0 10*3/uL (ref 0.0–0.5)
Eosinophils Relative: 0 %
HCT: 29.3 % — ABNORMAL LOW (ref 36.0–46.0)
Hemoglobin: 9.2 g/dL — ABNORMAL LOW (ref 12.0–15.0)
Immature Granulocytes: 1 %
Lymphocytes Relative: 35 %
Lymphs Abs: 0.9 10*3/uL (ref 0.7–4.0)
MCH: 34.1 pg — ABNORMAL HIGH (ref 26.0–34.0)
MCHC: 31.4 g/dL (ref 30.0–36.0)
MCV: 108.5 fL — ABNORMAL HIGH (ref 80.0–100.0)
Monocytes Absolute: 0.3 10*3/uL (ref 0.1–1.0)
Monocytes Relative: 10 %
Neutro Abs: 1.3 10*3/uL — ABNORMAL LOW (ref 1.7–7.7)
Neutrophils Relative %: 54 %
Platelets: 176 10*3/uL (ref 150–400)
RBC: 2.7 MIL/uL — ABNORMAL LOW (ref 3.87–5.11)
RDW: 12.5 % (ref 11.5–15.5)
WBC: 2.4 10*3/uL — ABNORMAL LOW (ref 4.0–10.5)
nRBC: 0 % (ref 0.0–0.2)

## 2019-10-11 LAB — COMPREHENSIVE METABOLIC PANEL
ALT: 15 U/L (ref 0–44)
AST: 23 U/L (ref 15–41)
Albumin: 2.7 g/dL — ABNORMAL LOW (ref 3.5–5.0)
Alkaline Phosphatase: 32 U/L — ABNORMAL LOW (ref 38–126)
Anion gap: 8 (ref 5–15)
BUN: 31 mg/dL — ABNORMAL HIGH (ref 8–23)
CO2: 21 mmol/L — ABNORMAL LOW (ref 22–32)
Calcium: 8 mg/dL — ABNORMAL LOW (ref 8.9–10.3)
Chloride: 110 mmol/L (ref 98–111)
Creatinine, Ser: 1.08 mg/dL — ABNORMAL HIGH (ref 0.44–1.00)
GFR calc Af Amer: 53 mL/min — ABNORMAL LOW (ref >60)
GFR calc non Af Amer: 46 mL/min — ABNORMAL LOW (ref >60)
Glucose, Bld: 111 mg/dL — ABNORMAL HIGH (ref 70–99)
Potassium: 4.5 mmol/L (ref 3.5–5.1)
Sodium: 139 mmol/L (ref 135–145)
Total Bilirubin: 0.7 mg/dL (ref 0.3–1.2)
Total Protein: 5.8 g/dL — ABNORMAL LOW (ref 6.5–8.1)

## 2019-10-11 LAB — C-REACTIVE PROTEIN: CRP: 9.1 mg/dL — ABNORMAL HIGH (ref <1.0)

## 2019-10-11 LAB — D-DIMER, QUANTITATIVE: D-Dimer, Quant: 1.21 ug/mL-FEU — ABNORMAL HIGH (ref 0.00–0.50)

## 2019-10-11 LAB — FERRITIN: Ferritin: 365 ng/mL — ABNORMAL HIGH (ref 11–307)

## 2019-10-11 MED ORDER — METOPROLOL SUCCINATE ER 25 MG PO TB24
12.5000 mg | ORAL_TABLET | Freq: Every day | ORAL | Status: DC
  Administered 2019-10-11 – 2019-10-14 (×3): 12.5 mg via ORAL
  Filled 2019-10-11 (×4): qty 1

## 2019-10-11 MED ORDER — SERTRALINE HCL 100 MG PO TABS
100.0000 mg | ORAL_TABLET | Freq: Every day | ORAL | Status: DC
  Administered 2019-10-11 – 2019-10-14 (×3): 100 mg via ORAL
  Filled 2019-10-11 (×4): qty 1

## 2019-10-11 MED ORDER — CLONAZEPAM 1 MG PO TABS
1.0000 mg | ORAL_TABLET | Freq: Every day | ORAL | Status: DC
  Administered 2019-10-11 – 2019-10-13 (×3): 1 mg via ORAL
  Filled 2019-10-11 (×3): qty 1

## 2019-10-11 NOTE — Progress Notes (Signed)
PROGRESS NOTE    Brittney Hicks  [id number] DOB: 09-14-1929 DOA: 10/08/2019 PCP: [name], MD    Brief Narrative:  86 year old Caucasian female with PMH of CKD stage III, frequent UTIs on suppressive antibiotics, anxiety, hypertension who tested positive for COVID-19 at an urgent care on 10/06/2019 presented to the ED from home with progressive pathology and fatigue.  At baseline she is independent with ADLs.  While in the ED she was saturating well on room air, otherwise hemodynamically stable.  Chest x-ray normal, procalcitonin negative.  Found to have elevated creatinine of 1.6.  Admitted for severe generalized weakness, AKI secondary to COVID-19.   Assessment & Plan:   Principal Problem:   COVID-19 virus infection Active Problems:   Anxiety   Essential hypertension   CKD (chronic kidney disease), stage III   Lower urinary tract infectious disease   Positive D dimer   COVID-19  COVID-19 infection Presented with fever, dry cough, generalized weakness, poor appetite but saturating well on room air.  Afebrile for 24 hours now. Continue remdesivir, dexamethasone Bilateral lower extremity ultrasound showed no DVT. Trend inflammatory markers PT/OT recommended home health Patient's daughter is unable to provide transport for remdesivir infusion as outpatient as she herself is sick  Fever Afebrile and off for almost 24 hours Most likely in setting of COVID-19 infection.  Blood culture 10/08/2019 negative to date.  Urine culture 10/08/2019 negative.  No leukocytosis.  Procalcitonin negative.  Do not suspect a bacterial super infection at this time.  Bilateral lower extremity ultrasound negative for DVTs.  Will hold off on further work-up at this time.  Acute kidney injury on CKD stage IIIa Per daughter patient does not have known kidney disease but noted to have CKD stage III under care everywhere.  Last creatinine 03/22/2019 (under Care Everywhere) 1.36.  Baseline appears to be  1.2-1.3. Creatinine elevated to 1.6 on admission.  Likely prerenal in setting of poor p.o. intake.  Improved with IV fluids. Appears to be at baseline now. Avoid nephrotoxic drugs.  Renally dose all medications.  Hypertension Stable.  Not on medications.  GERD with esophagitis History of hiatal hernia PPI twice daily  Recurrent UTIs Completed course of Keflex that was started at urgent care (reviewed under care everywhere)  Anxiety/MDD Restart home clonazepam  DVT prophylaxis: Heparin SQ Code Status: DNR  Family Communication: Updated patient's daughter [name] via phone.  Also spoke to her other daughter Lynelle Smoke. Disposition Plan: Plan for discharge home with home health services on 10/13/2019 to complete remdesivir infusions as daughter is unable to provide transport to complete them as outpatient.   Consultants:   None  Procedures:  None  Antimicrobials:   Remdesivir   Subjective: Afebrile overnight.  Feels tired.  Complains of abdominal pain and feels like she needs to have a bowel movement.  Notes she has this pain on and off at home and resolves on its own.  Objective: Vitals:   10/10/19 2048 10/11/19 0401 10/11/19 0420 10/11/19 1151  BP: 129/63 129/73 127/67 129/66  Pulse: 72 85 76 88  Resp: 18 18 20 16   Temp: 98.5 F (36.9 C) 98.1 F (36.7 C) 98.4 F (36.9 C) 98 F (36.7 C)  TempSrc: Oral Oral Oral Oral  SpO2: 99% 98% 97% 96%  Weight:      Height:        Intake/Output Summary (Last 24 hours) at 10/11/2019 1541 Last data filed at 10/11/2019 1300 Gross per 24 hour  Intake 519 ml  Output 650 ml  Net -131 ml   Filed Weights   10/08/19 2200  Weight: 57.7 kg    Examination:  General exam: Appears calm and comfortable.  Elderly tired appearing female Respiratory system: Clear to auscultation. Respiratory effort normal. Cardiovascular system: S1 & S2 heard, RRR. No JVD, murmurs. No pedal edema. Gastrointestinal system: Abdomen is nondistended,  soft and mild abdominal tenderness. Normal bowel sounds heard. Central nervous system: Alert and oriented. No obvious focal neurological deficits. Extremities: Moves all extremities voluntarily Skin: No rashes, lesions or ulcers Psychiatry: Judgement and insight appear normal. Mood & affect appropriate.   Data Reviewed: I have personally reviewed following labs and imaging studies  CBC: Recent Labs  Lab 10/08/19 0040 10/08/19 0440 10/09/19 0348 10/10/19 0319 10/11/19 0248  WBC 3.7*  --  4.5 4.6 2.4*  NEUTROABS 2.6  --  3.2 3.4 1.3*  HGB 10.5*  --  10.2* 9.5* 9.2*  HCT 32.2* 31.1* 32.6* 29.5* 29.3*  MCV 105.6*  --  105.2* 104.6* 108.5*  PLT 180  --  192 193 0000000   Basic Metabolic Panel: Recent Labs  Lab 10/08/19 0040 10/09/19 0348 10/10/19 0319 10/11/19 0248  NA 138 140 138 139  K 3.8 3.8 3.4* 4.5  CL 108 109 109 110  CO2 19* 21* 20* 21*  GLUCOSE 119* 98 97 111*  BUN 35* 27* 28* 31*  CREATININE 1.60* 1.46* 1.30* 1.08*  CALCIUM 8.3* 8.4* 7.9* 8.0*  MG  --  1.9  --   --    GFR: Estimated Creatinine Clearance: 28.5 mL/min (A) (by C-G formula based on SCr of 1.08 mg/dL (H)). Liver Function Tests: Recent Labs  Lab 10/08/19 0040 10/09/19 0348 10/10/19 0319 10/11/19 0248  AST 24 26 26 23   ALT 19 17 16 15   ALKPHOS 44 39 37* 32*  BILITOT 0.3 0.6 0.4 0.7  PROT 6.5 6.4* 5.9* 5.8*  ALBUMIN 3.3* 3.1* 2.8* 2.7*   No results for input(s): LIPASE, AMYLASE in the last 168 hours. No results for input(s): AMMONIA in the last 168 hours. Coagulation Profile: No results for input(s): INR, PROTIME in the last 168 hours. Cardiac Enzymes: Recent Labs  Lab 10/08/19 0440  CKTOTAL 184   BNP (last 3 results) No results for input(s): PROBNP in the last 8760 hours. HbA1C: No results for input(s): HGBA1C in the last 72 hours. CBG: No results for input(s): GLUCAP in the last 168 hours. Lipid Profile: No results for input(s): CHOL, HDL, LDLCALC, TRIG, CHOLHDL, LDLDIRECT in the  last 72 hours. Thyroid Function Tests: No results for input(s): TSH, T4TOTAL, FREET4, T3FREE, THYROIDAB in the last 72 hours. Anemia Panel: Recent Labs    10/10/19 0319 10/11/19 0248  FERRITIN 342* 365*   Sepsis Labs: Recent Labs  Lab 10/08/19 0040 10/09/19 0815  PROCALCITON 0.11 <0.10  LATICACIDVEN 0.6  --     Recent Results (from the past 240 hour(s))  SARS CORONAVIRUS 2 (TAT 6-24 HRS) Nasopharyngeal Nasopharyngeal Swab     Status: Abnormal   Collection Time: 10/08/19 12:40 AM   Specimen: Nasopharyngeal Swab  Result Value Ref Range Status   SARS Coronavirus 2 POSITIVE (A) NEGATIVE Final    Comment: RESULT CALLED TO, READ BACK BY AND VERIFIED WITH: [place] GARRISON RN.@[email or web address]  ON 1.17.21 BY TCALDWELL MT. (NOTE) SARS-CoV-2 target nucleic acids are DETECTED. The SARS-CoV-2 RNA is generally detectable in upper and lower respiratory specimens during the acute phase of infection. Positive results are indicative of the presence of SARS-CoV-2 RNA. Clinical correlation with  patient history and other diagnostic information is  necessary to determine patient infection status. Positive results do not rule out bacterial infection or co-infection with other viruses.  The expected result is Negative. Fact Sheet for Patients: SugarRoll.be Fact Sheet for Healthcare Providers: https://www.woods-mathews.com/ This test is not yet approved or cleared by the [place] FDA and  has been authorized for detection and/or diagnosis of SARS-CoV-2 by FDA under an Emergency Use Authorization (EUA). This EUA will remain  in effect (meaning this test ca n be used) for the duration of the COVID-19 declaration under Section 564(b)(1) of the Act, 21 U.S.C. section 360bbb-3(b)(1), unless the authorization is terminated or revoked sooner. Performed at [facility], [place] [address] [place], [place] [address]   Urine culture     Status: None   Collection  Time: 10/08/19 12:42 AM   Specimen: Urine, Catheterized  Result Value Ref Range Status   Specimen Description   Final    URINE, CATHETERIZED Performed at [place] [address] [place], [place] [address]    Special Requests   Final    Normal Performed at [facility], [place] [address] [place], [place] [address]    Culture   Final    NO GROWTH Performed at [facility], [place] [address], [place], [place] [address]    Report Status 10/09/2019 FINAL  Final  Blood Culture (routine x 2)     Status: None (Preliminary result)   Collection Time: 10/08/19  1:07 AM   Specimen: BLOOD  Result Value Ref Range Status   Specimen Description   Final    BLOOD RIGHT WRIST Performed at [place] [address], [place], [place] [address]    Special Requests   Final    BOTTLES DRAWN AEROBIC AND ANAEROBIC Blood Culture adequate volume Performed at [place] [address], [place], [place] [address]    Culture   Final    NO GROWTH 3 DAYS Performed at [facility], [place] [address], [place], [place] [address]    Report Status PENDING  Incomplete  Blood Culture (routine x 2)     Status: None (Preliminary result)   Collection Time: 10/08/19  1:07 AM   Specimen: BLOOD  Result Value Ref Range Status   Specimen Description   Final    BLOOD LEFT ANTECUBITAL Performed at [facility], [place] [address] [place], [place] [address]    Special Requests   Final    BOTTLES DRAWN AEROBIC AND ANAEROBIC Blood Culture results may not be optimal due to an excessive volume of blood received in culture bottles Performed at [place] [address] [place], [place] [address]    Culture   Final    NO GROWTH 3 DAYS Performed at [facility], [place] [address] [place], [place] [address]    Report Status PENDING  Incomplete         Radiology Studies: No results found.       Scheduled Meds: . clonazePAM  1 mg Oral QHS  . dexamethasone (DECADRON) injection  6 mg Intravenous Q24H  . heparin  5,000 Units Subcutaneous Q8H  . mouth rinse  15 mL Mouth Rinse BID  . metoprolol succinate  12.5 mg Oral Daily  . pantoprazole  40 mg Oral BID  . sertraline  100 mg Oral Daily  . sodium chloride flush  3 mL Intravenous Q12H  Continuous Infusions: . remdesivir 100 mg in NS 100 mL 100 mg (10/11/19 1003)     LOS: 2 days    Time spent: Spent more than 30 minutes in coordinating care for this patient including bedside patient care.   Lucky Cowboy, MD [facility] If 7PM-7AM, please contact night-coverage 10/11/2019, 3:41 PM

## 2019-10-11 NOTE — TOC Progression Note (Signed)
Transition of Care [facility]) - Progression Note    Patient Details  Name: Brittney Hicks MRN: [id number] Date of Birth: 07/05/1929  Transition of Care [facility]) CM/SW Contact  Purcell Mouton, RN Phone Number: 10/11/2019, 2:23 PM  Clinical Narrative:    Will continue to follow pt for Home Health. Call pt with no answer.        Expected Discharge Plan and Services                                                 Social Determinants of Health (SDOH) Interventions    Readmission Risk Interventions No flowsheet data found.

## 2019-10-11 NOTE — Progress Notes (Signed)
Pt's daughter called, stated she was bringing glasses, dentures, and IPad for pt today, will drop off at front desk. Also update pt's family member on status, questions asked and answered

## 2019-10-12 ENCOUNTER — Inpatient Hospital Stay (HOSPITAL_COMMUNITY): Payer: Medicare PPO

## 2019-10-12 LAB — COMPREHENSIVE METABOLIC PANEL
ALT: 16 U/L (ref 0–44)
ALT: 22 U/L (ref 0–44)
AST: 24 U/L (ref 15–41)
AST: 33 U/L (ref 15–41)
Albumin: 2.9 g/dL — ABNORMAL LOW (ref 3.5–5.0)
Albumin: 2.9 g/dL — ABNORMAL LOW (ref 3.5–5.0)
Alkaline Phosphatase: 36 U/L — ABNORMAL LOW (ref 38–126)
Alkaline Phosphatase: 46 U/L (ref 38–126)
Anion gap: 12 (ref 5–15)
Anion gap: 11 (ref 5–15)
BUN: 30 mg/dL — ABNORMAL HIGH (ref 8–23)
BUN: 27 mg/dL — ABNORMAL HIGH (ref 8–23)
CO2: 20 mmol/L — ABNORMAL LOW (ref 22–32)
CO2: 22 mmol/L (ref 22–32)
Calcium: 8.1 mg/dL — ABNORMAL LOW (ref 8.9–10.3)
Calcium: 8.1 mg/dL — ABNORMAL LOW (ref 8.9–10.3)
Chloride: 108 mmol/L (ref 98–111)
Chloride: 107 mmol/L (ref 98–111)
Creatinine, Ser: 1.26 mg/dL — ABNORMAL HIGH (ref 0.44–1.00)
Creatinine, Ser: 1.24 mg/dL — ABNORMAL HIGH (ref 0.44–1.00)
GFR calc Af Amer: 44 mL/min — ABNORMAL LOW (ref >60)
GFR calc Af Amer: 45 mL/min — ABNORMAL LOW (ref >60)
GFR calc non Af Amer: 38 mL/min — ABNORMAL LOW (ref >60)
GFR calc non Af Amer: 39 mL/min — ABNORMAL LOW (ref >60)
Glucose, Bld: 84 mg/dL (ref 70–99)
Glucose, Bld: 127 mg/dL — ABNORMAL HIGH (ref 70–99)
Potassium: 3.6 mmol/L (ref 3.5–5.1)
Potassium: 3.8 mmol/L (ref 3.5–5.1)
Sodium: 140 mmol/L (ref 135–145)
Sodium: 140 mmol/L (ref 135–145)
Total Bilirubin: 0.6 mg/dL (ref 0.3–1.2)
Total Bilirubin: 0.3 mg/dL (ref 0.3–1.2)
Total Protein: 5.7 g/dL — ABNORMAL LOW (ref 6.5–8.1)
Total Protein: 6 g/dL — ABNORMAL LOW (ref 6.5–8.1)

## 2019-10-12 LAB — CBC WITH DIFFERENTIAL/PLATELET
Abs Immature Granulocytes: 0.05 10*3/uL (ref 0.00–0.07)
Basophils Absolute: 0 10*3/uL (ref 0.0–0.1)
Basophils Relative: 0 %
Eosinophils Absolute: 0 10*3/uL (ref 0.0–0.5)
Eosinophils Relative: 0 %
HCT: 30.6 % — ABNORMAL LOW (ref 36.0–46.0)
Hemoglobin: 9.7 g/dL — ABNORMAL LOW (ref 12.0–15.0)
Immature Granulocytes: 1 %
Lymphocytes Relative: 32 %
Lymphs Abs: 1.7 10*3/uL (ref 0.7–4.0)
MCH: 33.9 pg (ref 26.0–34.0)
MCHC: 31.7 g/dL (ref 30.0–36.0)
MCV: 107 fL — ABNORMAL HIGH (ref 80.0–100.0)
Monocytes Absolute: 0.4 10*3/uL (ref 0.1–1.0)
Monocytes Relative: 7 %
Neutro Abs: 3 10*3/uL (ref 1.7–7.7)
Neutrophils Relative %: 60 %
Platelets: 230 10*3/uL (ref 150–400)
RBC: 2.86 MIL/uL — ABNORMAL LOW (ref 3.87–5.11)
RDW: 12.5 % (ref 11.5–15.5)
WBC: 5.1 10*3/uL (ref 4.0–10.5)
nRBC: 0 % (ref 0.0–0.2)

## 2019-10-12 LAB — CBC
HCT: 31.4 % — ABNORMAL LOW (ref 36.0–46.0)
Hemoglobin: 10.3 g/dL — ABNORMAL LOW (ref 12.0–15.0)
MCH: 33.8 pg (ref 26.0–34.0)
MCHC: 32.8 g/dL (ref 30.0–36.0)
MCV: 103 fL — ABNORMAL HIGH (ref 80.0–100.0)
Platelets: 245 10*3/uL (ref 150–400)
RBC: 3.05 MIL/uL — ABNORMAL LOW (ref 3.87–5.11)
RDW: 12.5 % (ref 11.5–15.5)
WBC: 6.4 10*3/uL (ref 4.0–10.5)
nRBC: 0 % (ref 0.0–0.2)

## 2019-10-12 LAB — FERRITIN: Ferritin: 374 ng/mL — ABNORMAL HIGH (ref 11–307)

## 2019-10-12 LAB — LIPASE, BLOOD: Lipase: 55 U/L — ABNORMAL HIGH (ref 11–51)

## 2019-10-12 LAB — D-DIMER, QUANTITATIVE: D-Dimer, Quant: 1.04 ug/mL-FEU — ABNORMAL HIGH (ref 0.00–0.50)

## 2019-10-12 LAB — C-REACTIVE PROTEIN: CRP: 5.4 mg/dL — ABNORMAL HIGH (ref <1.0)

## 2019-10-12 LAB — LACTIC ACID, PLASMA: Lactic Acid, Venous: 0.9 mmol/L (ref 0.5–1.9)

## 2019-10-12 MED ORDER — SODIUM CHLORIDE (PF) 0.9 % IJ SOLN
INTRAMUSCULAR | Status: AC
  Filled 2019-10-12: qty 50

## 2019-10-12 MED ORDER — POLYETHYLENE GLYCOL 3350 17 G PO PACK
17.0000 g | PACK | Freq: Every day | ORAL | Status: DC
  Administered 2019-10-13: 10:00:00 17 g via ORAL
  Filled 2019-10-12 (×3): qty 1

## 2019-10-12 MED ORDER — MORPHINE SULFATE (PF) 2 MG/ML IV SOLN
2.0000 mg | Freq: Once | INTRAVENOUS | Status: AC
  Administered 2019-10-12: 15:00:00 2 mg via INTRAVENOUS

## 2019-10-12 MED ORDER — MORPHINE SULFATE (PF) 2 MG/ML IV SOLN
2.0000 mg | INTRAVENOUS | Status: DC | PRN
  Administered 2019-10-12 (×2): 2 mg via INTRAVENOUS
  Filled 2019-10-12 (×3): qty 1

## 2019-10-12 MED ORDER — MORPHINE SULFATE (PF) 2 MG/ML IV SOLN
0.5000 mg | Freq: Once | INTRAVENOUS | Status: AC
  Administered 2019-10-12: 0.5 mg via INTRAVENOUS
  Filled 2019-10-12: qty 1

## 2019-10-12 MED ORDER — IOHEXOL 300 MG/ML  SOLN
75.0000 mL | Freq: Once | INTRAMUSCULAR | Status: AC | PRN
  Administered 2019-10-12: 15:00:00 75 mL via INTRAVENOUS

## 2019-10-12 MED ORDER — SODIUM CHLORIDE 0.9 % IV SOLN: INTRAVENOUS | Status: AC

## 2019-10-12 MED ORDER — PANTOPRAZOLE SODIUM 40 MG IV SOLR
40.0000 mg | Freq: Two times a day (BID) | INTRAVENOUS | Status: DC
  Administered 2019-10-12 – 2019-10-13 (×3): 40 mg via INTRAVENOUS
  Filled 2019-10-12 (×3): qty 40

## 2019-10-12 NOTE — Progress Notes (Signed)
This RN Called pt daughter's Sonya ([employer]) and Santiago Glad with updates.

## 2019-10-12 NOTE — Progress Notes (Signed)
Updated patient's daughter Lynelle Smoke and Santiago Glad through conference call. Patient's HCPOA Davy Pique is currently hospitalized and they requested Tammy be first point of contact. Tammy's number added to the contact details [phone].

## 2019-10-12 NOTE — Progress Notes (Signed)
PROGRESS NOTE    Brittney Hicks  [id number] DOB: November 01, 1928 DOA: 10/08/2019 PCP: [name], MD    Brief Narrative:  86 year old Caucasian female with PMH of CKD stage III, frequent UTIs on suppressive antibiotics, anxiety, hypertension who tested positive for COVID-19 at an urgent care on 10/06/2019 presented to the ED from home with progressive pathology and fatigue.  At baseline she is independent with ADLs.  While in the ED she was saturating well on room air, otherwise hemodynamically stable.  Chest x-ray normal, procalcitonin negative.  Found to have elevated creatinine of 1.6.  Admitted for severe generalized weakness, AKI secondary to COVID-19.   Assessment & Plan:   Principal Problem:   COVID-19 virus infection Active Problems:   Anxiety   Essential hypertension   CKD (chronic kidney disease), stage III   Lower urinary tract infectious disease   Positive D dimer   COVID-19  COVID-19 infection Presented with fever, dry cough, generalized weakness, poor appetite but saturating well on room air.  Afebrile for 24 hours now. Continue remdesivir, dexamethasone Bilateral lower extremity ultrasound showed no DVT. Trend inflammatory markers-improving overall PT/OT recommended home health Patient's daughter is unable to provide transport for remdesivir infusion as outpatient as she herself is sick  Abdominal pain Severe, generalized, ?Radiating to back, sudden onset.  Bowel sounds present Previous history of chronic constipation, GERD. Unclear etiology.  ?  Perforation versus ischemia.  Less likely obstruction given presence of bowel sounds and absence of concomitant nausea/vomiting.  Last bowel movement yesterday.   LFTs normal this morning.  Lipase 55 (do not suspect this is contributing) Morphine as needed for pain control Stat CT abdomen pelvis with contrast to delineate etiology We will get repeat stat CBC, CMP, lactic acid  Fever Afebrile and off for almost 24  hours Most likely in setting of COVID-19 infection.  Blood culture 10/08/2019 negative to date.  Urine culture 10/08/2019 negative.  No leukocytosis.  Procalcitonin negative.  Do not suspect a bacterial super infection at this time.  Bilateral lower extremity ultrasound negative for DVTs.  Will hold off on further work-up at this time.  Acute kidney injury on CKD stage IIIa Per daughter patient does not have known kidney disease but noted to have CKD stage III under care everywhere.  Last creatinine 03/22/2019 (under Care Everywhere) 1.36.  Baseline appears to be 1.2-1.3. Creatinine elevated to 1.6 on admission.  Likely prerenal in setting of poor p.o. intake.  Improved with IV fluids. Appears to be at baseline now. Avoid nephrotoxic drugs.  Renally dose all medications. We will continue gentle hydration given she is n.p.o. and is going to receive IV contrast  Macrocytic anemia Vitamin B12 normal, TSH normal Unclear etiology  Hypertension Stable.  Not on medications.  GERD with esophagitis History of hiatal hernia PPI twice daily  Recurrent UTIs Completed course of Keflex that was started at urgent care (reviewed under care everywhere)  Anxiety/MDD Restart home clonazepam  DVT prophylaxis: Heparin SQ Code Status: DNR  Family Communication: Updated patient's daughter Lynelle Smoke and Santiago Glad via telephone Disposition Plan: She remained in the hospital to complete remdesivir infusions with tentative plan of discharge on 10/13/2019 but has developed acute abdominal pain pending further work-up  Consultants:   None  Procedures:  None  Antimicrobials:   Remdesivir   Subjective: Complains of severe abdominal pain that started this morning.  Had her dinner last night with no issues and slept well.  Denies nausea, vomiting or diarrhea.  Last bowel movement  yesterday.  Denies history of similar abdominal pain.  Objective: Vitals:   10/11/19 1937 10/12/19 0502 10/12/19 0856 10/12/19 1313   BP: 97/82 124/64 127/73 (!) 159/87  Pulse: 79 87  (!) 101  Resp: 16 18 18 20   Temp: 99.2 F (37.3 C) 98.4 F (36.9 C) 98.1 F (36.7 C) 98.7 F (37.1 C)  TempSrc: Oral Oral Oral Oral  SpO2: 95% 96% 95% 93%  Weight:      Height:        Intake/Output Summary (Last 24 hours) at 10/12/2019 1428 Last data filed at 10/11/2019 2100 Gross per 24 hour  Intake -  Output 600 ml  Net -600 ml   Filed Weights   10/08/19 2200  Weight: 57.7 kg    Examination:  General exam: Appears in pain.  Elderly tired appearing female Respiratory system: Clear to auscultation. Respiratory effort normal. Cardiovascular system: S1 & S2 heard, RRR. No JVD, murmurs. No pedal edema. Gastrointestinal system: Abdomen is nondistended, soft and diffuse abdominal tenderness. Normal bowel sounds heard. Central nervous system: Alert and oriented. No obvious focal neurological deficits. Extremities: Moves all extremities voluntarily Skin: No rashes, lesions or ulcers Psychiatry: Judgement and insight appear normal. Mood & affect appropriate.   Data Reviewed: I have personally reviewed following labs and imaging studies  CBC: Recent Labs  Lab 10/08/19 0040 10/08/19 0440 10/09/19 0348 10/10/19 0319 10/11/19 0248 10/12/19 0300  WBC 3.7*  --  4.5 4.6 2.4* 5.1  NEUTROABS 2.6  --  3.2 3.4 1.3* 3.0  HGB 10.5*  --  10.2* 9.5* 9.2* 9.7*  HCT 32.2* 31.1* 32.6* 29.5* 29.3* 30.6*  MCV 105.6*  --  105.2* 104.6* 108.5* 107.0*  PLT 180  --  192 193 176 [id number]   Basic Metabolic Panel: Recent Labs  Lab 10/08/19 0040 10/09/19 0348 10/10/19 0319 10/11/19 0248 10/12/19 0300  NA 138 140 138 139 140  K 3.8 3.8 3.4* 4.5 3.6  CL 108 109 109 110 108  CO2 19* 21* 20* 21* 20*  GLUCOSE 119* 98 97 111* 84  BUN 35* 27* 28* 31* 30*  CREATININE 1.60* 1.46* 1.30* 1.08* 1.26*  CALCIUM 8.3* 8.4* 7.9* 8.0* 8.1*  MG  --  1.9  --   --   --    GFR: Estimated Creatinine Clearance: 24.4 mL/min (A) (by C-G formula based on SCr of  1.26 mg/dL (H)). Liver Function Tests: Recent Labs  Lab 10/08/19 0040 10/09/19 0348 10/10/19 0319 10/11/19 0248 10/12/19 0300  AST 24 26 26 23 24   ALT 19 17 16 15 16   ALKPHOS 44 39 37* 32* 36*  BILITOT 0.3 0.6 0.4 0.7 0.6  PROT 6.5 6.4* 5.9* 5.8* 5.7*  ALBUMIN 3.3* 3.1* 2.8* 2.7* 2.9*   Recent Labs  Lab 10/12/19 0300  LIPASE 55*   No results for input(s): AMMONIA in the last 168 hours. Coagulation Profile: No results for input(s): INR, PROTIME in the last 168 hours. Cardiac Enzymes: Recent Labs  Lab 10/08/19 0440  CKTOTAL 184   BNP (last 3 results) No results for input(s): PROBNP in the last 8760 hours. HbA1C: No results for input(s): HGBA1C in the last 72 hours. CBG: No results for input(s): GLUCAP in the last 168 hours. Lipid Profile: No results for input(s): CHOL, HDL, LDLCALC, TRIG, CHOLHDL, LDLDIRECT in the last 72 hours. Thyroid Function Tests: No results for input(s): TSH, T4TOTAL, FREET4, T3FREE, THYROIDAB in the last 72 hours. Anemia Panel: Recent Labs    10/11/19 0248 10/12/19 0300  FERRITIN  365* 374*   Sepsis Labs: Recent Labs  Lab 10/08/19 0040 10/09/19 0815  PROCALCITON 0.11 <0.10  LATICACIDVEN 0.6  --     Recent Results (from the past 240 hour(s))  SARS CORONAVIRUS 2 (TAT 6-24 HRS) Nasopharyngeal Nasopharyngeal Swab     Status: Abnormal   Collection Time: 10/08/19 12:40 AM   Specimen: Nasopharyngeal Swab  Result Value Ref Range Status   SARS Coronavirus 2 POSITIVE (A) NEGATIVE Final    Comment: RESULT CALLED TO, READ BACK BY AND VERIFIED WITH: [place] GARRISON RN.@[email or web address]  ON 1.17.21 BY TCALDWELL MT. (NOTE) SARS-CoV-2 target nucleic acids are DETECTED. The SARS-CoV-2 RNA is generally detectable in upper and lower respiratory specimens during the acute phase of infection. Positive results are indicative of the presence of SARS-CoV-2 RNA. Clinical correlation with patient history and other diagnostic information is  necessary to determine  patient infection status. Positive results do not rule out bacterial infection or co-infection with other viruses.  The expected result is Negative. Fact Sheet for Patients: SugarRoll.be Fact Sheet for Healthcare Providers: https://www.woods-mathews.com/ This test is not yet approved or cleared by the [place] FDA and  has been authorized for detection and/or diagnosis of SARS-CoV-2 by FDA under an Emergency Use Authorization (EUA). This EUA will remain  in effect (meaning this test ca n be used) for the duration of the COVID-19 declaration under Section 564(b)(1) of the Act, 21 U.S.C. section 360bbb-3(b)(1), unless the authorization is terminated or revoked sooner. Performed at [facility], [place] [address] [place], [place] [address]   Urine culture     Status: None   Collection Time: 10/08/19 12:42 AM   Specimen: Urine, Catheterized  Result Value Ref Range Status   Specimen Description   Final    URINE, CATHETERIZED Performed at [facility] [address] [place], [place] [address]    Special Requests   Final    Normal Performed at [facility], [place] [address] [place], [place] [address]    Culture   Final    NO GROWTH Performed at [facility], [place] [address] [place], [place] [address]    Report Status 10/09/2019 FINAL  Final  Blood Culture (routine x 2)     Status: None (Preliminary result)   Collection Time: 10/08/19  1:07 AM   Specimen: BLOOD  Result Value Ref Range Status   Specimen Description   Final    BLOOD RIGHT WRIST Performed at [facility] [address], [place], [place] [address]    Special Requests   Final    BOTTLES DRAWN AEROBIC AND ANAEROBIC Blood Culture adequate volume Performed at [facility] [address] [place], [place] [address]    Culture   Final    NO GROWTH 4 DAYS Performed at [facility], [place] [address] [place], [place] [address]    Report Status PENDING  Incomplete  Blood Culture (routine x 2)     Status: None (Preliminary result)   Collection Time: 10/08/19  1:07 AM   Specimen: BLOOD  Result Value Ref Range Status   Specimen Description   Final    BLOOD LEFT ANTECUBITAL Performed at [facility], [place] [address] [place], [place] [address]    Special Requests   Final    BOTTLES DRAWN AEROBIC AND ANAEROBIC Blood Culture results may not be optimal due to an excessive volume of blood received in culture bottles Performed at [facility], [place] [address], [place], [place] [address]    Culture   Final    NO GROWTH 4 DAYS Performed at [facility], [place] [address] [place], [place] [address]    Report Status PENDING  Incomplete         Radiology Studies: No results found.      Scheduled Meds: . clonazePAM  1 mg Oral QHS  . dexamethasone (DECADRON) injection  6 mg Intravenous Q24H  . heparin  5,000 Units Subcutaneous Q8H  . mouth rinse  15 mL Mouth Rinse BID  . metoprolol succinate  12.5 mg Oral Daily  . pantoprazole (PROTONIX) IV  40 mg Intravenous Q12H  . sertraline  100 mg Oral Daily  . sodium chloride flush  3 mL Intravenous Q12H   Continuous Infusions: . sodium chloride 75 mL/hr at 10/12/19 0955  . remdesivir 100 mg in NS 100 mL 100 mg (10/12/19 0957)     LOS: 3 days    Time spent: Spent more than 30 minutes in coordinating care for this patient including bedside patient care.   Lucky Cowboy, MD [facility] If 7PM-7AM, please contact night-coverage 10/12/2019, 2:28 PM

## 2019-10-12 NOTE — Progress Notes (Signed)
OT Cancellation Note  Patient Details Name: Brittney Hicks MRN: [id number] DOB: 24-Apr-1929   Cancelled Treatment:    Reason Eval/Treat Not Completed: Pain limiting ability to participate.  Per RN, pt's pain is a 10 and pain medication is not helping. Will check back another day.  , 10/12/2019, 12:54 PM  Karsten Ro, OTR/L Acute Rehabilitation Services 10/12/2019

## 2019-10-12 NOTE — Progress Notes (Signed)
Updated patient's daughter Lynelle Smoke about CT abdomen pelvis results as requested.  Imaging reviewed.  Labs reviewed.  Patient's pain has improved with morphine.  Unclear etiology.  With history of chronic constipation, will add a more aggressive bowel regimen.  Budd Palmer, MD Internal Medicine  Hospitalist

## 2019-10-13 LAB — COMPREHENSIVE METABOLIC PANEL
ALT: 23 U/L (ref 0–44)
AST: 29 U/L (ref 15–41)
Albumin: 2.7 g/dL — ABNORMAL LOW (ref 3.5–5.0)
Alkaline Phosphatase: 47 U/L (ref 38–126)
Anion gap: 12 (ref 5–15)
BUN: 25 mg/dL — ABNORMAL HIGH (ref 8–23)
CO2: 21 mmol/L — ABNORMAL LOW (ref 22–32)
Calcium: 7.9 mg/dL — ABNORMAL LOW (ref 8.9–10.3)
Chloride: 108 mmol/L (ref 98–111)
Creatinine, Ser: 1.13 mg/dL — ABNORMAL HIGH (ref 0.44–1.00)
GFR calc Af Amer: 50 mL/min — ABNORMAL LOW (ref >60)
GFR calc non Af Amer: 43 mL/min — ABNORMAL LOW (ref >60)
Glucose, Bld: 81 mg/dL (ref 70–99)
Potassium: 3.3 mmol/L — ABNORMAL LOW (ref 3.5–5.1)
Sodium: 141 mmol/L (ref 135–145)
Total Bilirubin: 0.4 mg/dL (ref 0.3–1.2)
Total Protein: 5.7 g/dL — ABNORMAL LOW (ref 6.5–8.1)

## 2019-10-13 LAB — CBC WITH DIFFERENTIAL/PLATELET
Abs Immature Granulocytes: 0.09 10*3/uL — ABNORMAL HIGH (ref 0.00–0.07)
Basophils Absolute: 0 10*3/uL (ref 0.0–0.1)
Basophils Relative: 0 %
Eosinophils Absolute: 0 10*3/uL (ref 0.0–0.5)
Eosinophils Relative: 0 %
HCT: 30.3 % — ABNORMAL LOW (ref 36.0–46.0)
Hemoglobin: 9.4 g/dL — ABNORMAL LOW (ref 12.0–15.0)
Immature Granulocytes: 2 %
Lymphocytes Relative: 24 %
Lymphs Abs: 1.4 10*3/uL (ref 0.7–4.0)
MCH: 33.6 pg (ref 26.0–34.0)
MCHC: 31 g/dL (ref 30.0–36.0)
MCV: 108.2 fL — ABNORMAL HIGH (ref 80.0–100.0)
Monocytes Absolute: 0.5 10*3/uL (ref 0.1–1.0)
Monocytes Relative: 8 %
Neutro Abs: 3.9 10*3/uL (ref 1.7–7.7)
Neutrophils Relative %: 66 %
Platelets: 247 10*3/uL (ref 150–400)
RBC: 2.8 MIL/uL — ABNORMAL LOW (ref 3.87–5.11)
RDW: 12.5 % (ref 11.5–15.5)
WBC: 5.9 10*3/uL (ref 4.0–10.5)
nRBC: 0 % (ref 0.0–0.2)

## 2019-10-13 LAB — CULTURE, BLOOD (ROUTINE X 2)
Culture: NO GROWTH
Culture: NO GROWTH
Special Requests: ADEQUATE

## 2019-10-13 LAB — D-DIMER, QUANTITATIVE: D-Dimer, Quant: 0.87 ug/mL-FEU — ABNORMAL HIGH (ref 0.00–0.50)

## 2019-10-13 LAB — FERRITIN: Ferritin: 289 ng/mL (ref 11–307)

## 2019-10-13 LAB — C-REACTIVE PROTEIN: CRP: 5.6 mg/dL — ABNORMAL HIGH (ref <1.0)

## 2019-10-13 MED ORDER — PANTOPRAZOLE SODIUM 40 MG PO TBEC
40.0000 mg | DELAYED_RELEASE_TABLET | Freq: Two times a day (BID) | ORAL | Status: DC
  Administered 2019-10-13 – 2019-10-14 (×2): 40 mg via ORAL
  Filled 2019-10-13 (×2): qty 1

## 2019-10-13 MED ORDER — POTASSIUM CHLORIDE 10 MEQ/100ML IV SOLN
10.0000 meq | INTRAVENOUS | Status: AC
  Administered 2019-10-13 (×3): 10 meq via INTRAVENOUS
  Filled 2019-10-13 (×3): qty 100

## 2019-10-13 MED ORDER — MAGNESIUM CITRATE PO SOLN
1.0000 | Freq: Once | ORAL | Status: DC
  Filled 2019-10-13: qty 296

## 2019-10-13 MED ORDER — SODIUM CHLORIDE 0.9 % IV SOLN: INTRAVENOUS | Status: AC

## 2019-10-13 NOTE — Progress Notes (Signed)
Spoke with Tammy and updated.

## 2019-10-13 NOTE — Progress Notes (Addendum)
Spoke with daughter Lynelle Smoke and updated her on pt's condition. Pt has some confusion, but is easily reoriented.  Pt T 99.1 this am. Fluids encouraged as well as Chiropodist.  Pt denies pain this am.

## 2019-10-13 NOTE — Progress Notes (Signed)
Occupational Therapy Treatment Patient Details Name: Brittney Hicks MRN: [id number] DOB: 1929/08/12 Today'Hicks Date: 10/13/2019    History of present illness 86 year old Caucasian female with PMH of CKD stage III, frequent UTIs on suppressive antibiotics, anxiety, hypertension who tested positive for COVID-19 at an urgent care on 10/06/2019 presented to the ED from home with progressive pathology and fatigue.  At baseline she is independent with ADLs.  While in the ED she was saturating well on room air, otherwise hemodynamically stable.  Chest x-ray normal, procalcitonin negative.  Found to have elevated creatinine of 1.6 with no known prior kidney disease.  Admitted for severe generalized weakness, AKI secondary to COVID-19.   OT comments  Pt fatiqued today. She had large BM while in bed. She stated she called for assistance and no one came.  Assisted with toilet transfer, bathing, and dressing.  Pt weak and shaky.  Would benefit from SNF to regain strength (pt is Covid +).  If home , recommend 24/7 and HHOT  Follow Up Recommendations  SNF;Supervision/Assistance - 24 hour ; Reader    Equipment Recommendations  3 in 1 bedside commode    Recommendations for Other Services      Precautions / Restrictions Precautions Precautions: Fall Restrictions Weight Bearing Restrictions: No       Mobility Bed Mobility         Supine to sit: Mod assist Sit to supine: Min guard   General bed mobility comments: assist for trunk to sit up and mod A to scoot forward to EOB  Transfers   Equipment used: Rolling walker (2 wheeled)   Sit to Stand: Min assist Stand pivot transfers: Min assist       General transfer comment: assist to rise and stabilize.  Cues for UE placement    Balance                                           ADL either performed or assessed with clinical judgement   ADL       Grooming: Standing;Wash/dry hands;Minimal assistance       Lower Body  Bathing: Maximal assistance   Upper Body Dressing : Moderate assistance   Lower Body Dressing: Maximal assistance   Toilet Transfer: Minimal assistance;Stand-pivot;BSC;RW   Toileting- Clothing Manipulation and Hygiene: Maximal assistance         General ADL Comments: pt had a large BM. Assisted to [facility] and with cleaning up/changing.  Pt fatiqued and shaky     Vision       Perception     Praxis      Cognition Arousal/Alertness: Awake/alert Behavior During Therapy: WFL for tasks assessed/performed Overall Cognitive Status: No family/caregiver present to determine baseline cognitive functioning                                 General Comments: HOH, still needed multimodal cues at times        Exercises     Shoulder Instructions       General Comments      Pertinent Vitals/ Pain       Pain Assessment: Faces Faces Pain Scale: Hurts little more Pain Location: abdomen and buttocks Pain Descriptors / Indicators: Sore Pain Intervention(Hicks): Limited activity within patient'Hicks tolerance;Monitored during session;Repositioned  Home Living  Prior Functioning/Environment              Frequency  Min 2X/week        Progress Toward Goals  OT Goals(current goals can now be found in the care plan section)  Progress towards OT goals: Not progressing toward goals - comment(fatiqued today)     Plan      Co-evaluation    PT/OT/SLP Co-Evaluation/Treatment: Yes Reason for Co-Treatment: For patient/therapist safety PT goals addressed during session: Mobility/safety with mobility OT goals addressed during session: ADL'Hicks and self-care      AM-PAC OT "6 Clicks" Daily Activity     Outcome Measure   Help from another person eating meals?: None Help from another person taking care of personal grooming?: A Little Help from another person toileting, which includes using toliet, bedpan, or  urinal?: A Lot Help from another person bathing (including washing, rinsing, drying)?: A Lot Help from another person to put on and taking off regular upper body clothing?: A Lot Help from another person to put on and taking off regular lower body clothing?: A Lot 6 Click Score: 15    End of Session    OT Visit Diagnosis: Muscle weakness (generalized) (M62.81);Unsteadiness on feet (R26.81)   Activity Tolerance Patient limited by fatigue   Patient Left in bed;with call bell/phone within reach;with bed alarm set   Nurse Communication          Time: [id number] OT Time Calculation (min): 31 min  Charges: OT General Charges $OT Visit: 1 Visit OT Treatments $Self Care/Home Management : 8-22 mins  Brittney Hicks, OTR/L Acute Rehabilitation Services 10/13/2019   Brittney Hicks 10/13/2019, 4:14 PM

## 2019-10-13 NOTE — TOC Progression Note (Signed)
Transition of Care [facility]) - Progression Note    Patient Details  Name: Brittney Hicks MRN: [id number] Date of Birth: 19-Oct-1928  Transition of [facility]) CM/SW Contact  Purcell Mouton, RN Phone Number: 10/13/2019, 4:23 PM  Clinical Narrative:    Pt going to her daughter Tammy's home [address], [place], [place] [address]. Alvis Lemmings will follow at discharge.         Expected Discharge Plan and Services                                                 Social Determinants of Health (SDOH) Interventions    Readmission Risk Interventions No flowsheet data found.

## 2019-10-13 NOTE — Progress Notes (Signed)
Physical Therapy Treatment Patient Details Name: Brittney Hicks MRN: [id number] DOB: [birth year] Today's Date: 10/13/2019    History of Present Illness 86 year old Caucasian female with PMH of CKD stage III, frequent UTIs on suppressive antibiotics, anxiety, hypertension who tested positive for COVID-19 at an urgent care on 10/06/2019 presented to the ED from home with progressive pathology and fatigue.  At baseline she is independent with ADLs.  While in the ED she was saturating well on room air, otherwise hemodynamically stable.  Chest x-ray normal, procalcitonin negative.  Found to have elevated creatinine of 1.6 with no known prior kidney disease.  Admitted for severe generalized weakness, AKI secondary to COVID-19.    PT Comments    Pt very shaky today and fatigues easily.  Pt found with large BM in bed and assisted to be cleaned up and bedding clothing changed.  Pt tolerated standing at sink to wash up and ambulating short distance around the bed but unable to tolerate further activity.  Will follow in am and reassess dc recommendations.   Follow Up Recommendations  Home health PT;Supervision/Assistance - 24 hour;Other (comment)     Equipment Recommendations  None recommended by PT    Recommendations for Other Services       Precautions / Restrictions Precautions Precautions: Fall Restrictions Weight Bearing Restrictions: No    Mobility  Bed Mobility Overal bed mobility: Needs Assistance Bed Mobility: Sit to Supine;Supine to Sit     Supine to sit: Mod assist Sit to supine: Min guard   General bed mobility comments: assist for trunk to sit up and mod A to scoot forward to EOB  Transfers Overall transfer level: Needs assistance Equipment used: Rolling walker (2 wheeled) Transfers: Sit to/from Stand Sit to Stand: Min assist Stand pivot transfers: Min assist       General transfer comment: assist to rise and stabilize.  Cues for UE  placement  Ambulation/Gait Ambulation/Gait assistance: Min assist Gait Distance (Feet): 18 Feet Assistive device: Rolling walker (2 wheeled) Gait Pattern/deviations: Step-through pattern;Decreased step length - right;Decreased step length - left;Shuffle;Trunk flexed Gait velocity: decr   General Gait Details: cues for posture and position from RW; pt very unsteady and fatigues easily   Stairs             Wheelchair Mobility    Modified Rankin (Stroke Patients Only)       Balance Overall balance assessment: Needs assistance Sitting-balance support: No upper extremity supported;Feet supported Sitting balance-Leahy Scale: Fair     Standing balance support: Bilateral upper extremity supported;During functional activity Standing balance-Leahy Scale: Poor                              Cognition Arousal/Alertness: Awake/alert Behavior During Therapy: WFL for tasks assessed/performed Overall Cognitive Status: No family/caregiver present to determine baseline cognitive functioning                                 General Comments: HOH, still needed multimodal cues at times      Exercises      General Comments        Pertinent Vitals/Pain Pain Assessment: Faces Faces Pain Scale: Hurts little more Pain Location: abdomen and buttocks Pain Descriptors / Indicators: Sore Pain Intervention(s): Limited activity within patient's tolerance;Monitored during session    Home Living  Prior Function            PT Goals (current goals can now be found in the care plan section) Acute Rehab PT Goals Patient Stated Goal: return to PLOF PT Goal Formulation: With patient Time For Goal Achievement: 10/23/19 Potential to Achieve Goals: Fair Progress towards PT goals: Not progressing toward goals - comment(fatigued)    Frequency    Min 3X/week      PT Plan Current plan remains appropriate    Co-evaluation  PT/OT/SLP Co-Evaluation/Treatment: Yes Reason for Co-Treatment: For patient/therapist safety PT goals addressed during session: Mobility/safety with mobility OT goals addressed during session: ADL's and self-care      AM-PAC PT "6 Clicks" Mobility   Outcome Measure  Help needed turning from your back to your side while in a flat bed without using bedrails?: A Little Help needed moving from lying on your back to sitting on the side of a flat bed without using bedrails?: A Lot Help needed moving to and from a bed to a chair (including a wheelchair)?: A Little Help needed standing up from a chair using your arms (e.g., wheelchair or bedside chair)?: A Little Help needed to walk in hospital room?: A Little Help needed climbing 3-5 steps with a railing? : A Lot 6 Click Score: 16    End of Session Equipment Utilized During Treatment: Gait belt Activity Tolerance: Patient limited by fatigue Patient left: in bed;with call bell/phone within reach;with bed alarm set Nurse Communication: Mobility status PT Visit Diagnosis: Muscle weakness (generalized) (M62.81);Difficulty in walking, not elsewhere classified (R26.2)     Time: [id number] PT Time Calculation (min) (ACUTE ONLY): 30 min  Charges:  $Gait Training: 8-22 mins                     [place] Pager [phone] Office [phone]    Sierrah Luevano 10/13/2019, 4:51 PM

## 2019-10-13 NOTE — Progress Notes (Signed)
PROGRESS NOTE    RANASIA MASSARELLI  [id number] DOB: August 12, 1929 DOA: 10/08/2019 PCP: [name], MD    Brief Narrative:  86 year old Caucasian female with PMH of CKD stage III, frequent UTIs on suppressive antibiotics, anxiety, hypertension who tested positive for COVID-19 at an urgent care on 10/06/2019 presented to the ED from home with progressive pathology and fatigue.  At baseline she is independent with ADLs.  While in the ED she was saturating well on room air, otherwise hemodynamically stable.  Chest x-ray normal, procalcitonin negative.  Found to have elevated creatinine of 1.6.  Admitted for severe generalized weakness, AKI secondary to COVID-19.   Assessment & Plan:   Principal Problem:   COVID-19 virus infection Active Problems:   Anxiety   Essential hypertension   CKD (chronic kidney disease), stage III   Lower urinary tract infectious disease   Positive D dimer   COVID-19  COVID-19 infection Presented with fever, dry cough, generalized weakness, poor appetite but saturating well on room air.  Afebrile for 24-48 hours now. Completed remdesivir 10/13/19, dexamethasone Bilateral lower extremity ultrasound showed no DVT. Trend inflammatory markers-improving overall PT/OT recommended home with home health but needed one more session   Abdominal pain Severe, generalized, ?Radiating to back, sudden onset.  Bowel sounds present Previous history of chronic constipation, GERD. Unclear etiology.  ?  CT A/P negative for acute abnormality.  LFTs normal this morning.  Lipase 55 (do not suspect this is contributing). Lactic acid normal.  Morphine as needed for pain control Add bowel regimen for chronic constipation   Fever Afebrile now for 24-48 hrs Most likely in setting of COVID-19 infection.  Blood culture 10/08/2019 negative to date.  Urine culture 10/08/2019 negative.  No leukocytosis.  Procalcitonin negative.  Do not suspect a bacterial super infection at this time.   Bilateral lower extremity ultrasound negative for DVTs.  Will hold off on further work-up at this time.  Acute kidney injury on CKD stage IIIa Per daughter patient does not have known kidney disease but noted to have CKD stage III under care everywhere.  Last creatinine 03/22/2019 (under Care Everywhere) 1.36.  Baseline appears to be 1.2-1.3. Creatinine elevated to 1.6 on admission.  Likely prerenal in setting of poor p.o. intake.  Improved with IV fluids. Appears to be at baseline now. Avoid nephrotoxic drugs.  Renally dose all medications.  Macrocytic anemia Vitamin B12 normal, TSH normal Unclear etiology  Hypertension Stable.  Not on medications.  GERD without esophagitis History of hiatal hernia PPI twice daily  Recurrent UTIs Completed course of Keflex that was started at urgent care (reviewed under care everywhere)  Anxiety/MDD Continue home clonazepam  DVT prophylaxis: Heparin SQ Code Status: DNR  Family Communication: Updated patient's daughter Lynelle Smoke and Santiago Glad via telephone Disposition Plan: She remained in the hospital to complete remdesivir infusions which has been completed but she noted to be weak by PT and recommended one more session. Plan for another PT session tomorrow morning. Plan for discharge tomorrow with [facility] PT/OT (ordered).   Consultants:   None  Procedures:  None  Antimicrobials:   Remdesivir (completed)   Subjective: Denies abdominal pain.  Has a poor appetite because nothing tastes good.  She would like to go home.  Feeling depressed. Discussed this with her daughter who says this is her baseline and that she does not usually have a good appetite and tends to be negative.  She is okay with having the patient home with home health services as [facility]  as the patient is afebrile and not having abdominal pain.  Objective: Vitals:   10/13/19 0323 10/13/19 0446 10/13/19 0544 10/13/19 0840  BP:   112/65 117/63  Pulse:   96 (!) 103  Resp:   16 20  Temp:    99.1 F (37.3 C) 98.8 F (37.1 C)  TempSrc:   Oral Axillary  SpO2: 94% 92% 92% 94%  Weight:      Height:        Intake/Output Summary (Last 24 hours) at 10/13/2019 1656 Last data filed at 10/13/2019 0600 Gross per 24 hour  Intake 1022.86 ml  Output 900 ml  Net 122.86 ml   Filed Weights   10/08/19 2200  Weight: 57.7 kg    Examination:  General exam: Appears in pain.  Elderly tired appearing female Respiratory system: Clear to auscultation. Respiratory effort normal. Cardiovascular system: S1 & S2 heard, RRR. No JVD, murmurs. No pedal edema. Gastrointestinal system: Abdomen is nondistended, soft and diffuse abdominal tenderness. Normal bowel sounds heard. Central nervous system: Alert and oriented. No obvious focal neurological deficits. Extremities: Moves all extremities voluntarily Skin: No rashes, lesions or ulcers Psychiatry: Judgement and insight appear normal. Mood & affect appropriate.   Data Reviewed: I have personally reviewed following labs and imaging studies  CBC: Recent Labs  Lab 10/09/19 0348 10/09/19 0348 10/10/19 0319 10/11/19 0248 10/12/19 0300 10/12/19 1534 10/13/19 0250  WBC 4.5   < > 4.6 2.4* 5.1 6.4 5.9  NEUTROABS 3.2  --  3.4 1.3* 3.0  --  3.9  HGB 10.2*   < > 9.5* 9.2* 9.7* 10.3* 9.4*  HCT 32.6*   < > 29.5* 29.3* 30.6* 31.4* 30.3*  MCV 105.2*   < > 104.6* 108.5* 107.0* 103.0* 108.2*  PLT 192   < > 193 176 230 245 247   < > = values in this interval not displayed.   Basic Metabolic Panel: Recent Labs  Lab 10/09/19 0348 10/09/19 0348 10/10/19 0319 10/11/19 0248 10/12/19 0300 10/12/19 1534 10/13/19 0250  NA 140   < > 138 139 140 140 141  K 3.8   < > 3.4* 4.5 3.6 3.8 3.3*  CL 109   < > 109 110 108 107 108  CO2 21*   < > 20* 21* 20* 22 21*  GLUCOSE 98   < > 97 111* 84 127* 81  BUN 27*   < > 28* 31* 30* 27* 25*  CREATININE 1.46*   < > 1.30* 1.08* 1.26* 1.24* 1.13*  CALCIUM 8.4*   < > 7.9* 8.0* 8.1* 8.1* 7.9*  MG 1.9  --   --   --    --   --   --    < > = values in this interval not displayed.   GFR: Estimated Creatinine Clearance: 27.2 mL/min (A) (by C-G formula based on SCr of 1.13 mg/dL (H)). Liver Function Tests: Recent Labs  Lab 10/10/19 0319 10/11/19 0248 10/12/19 0300 10/12/19 1534 10/13/19 0250  AST 26 23 24  33 29  ALT 16 15 16 22 23   ALKPHOS 37* 32* 36* 46 47  BILITOT 0.4 0.7 0.6 0.3 0.4  PROT 5.9* 5.8* 5.7* 6.0* 5.7*  ALBUMIN 2.8* 2.7* 2.9* 2.9* 2.7*   Recent Labs  Lab 10/12/19 0300  LIPASE 55*   No results for input(s): AMMONIA in the last 168 hours. Coagulation Profile: No results for input(s): INR, PROTIME in the last 168 hours. Cardiac Enzymes: Recent Labs  Lab 10/08/19 0440  CKTOTAL 184  BNP (last 3 results) No results for input(s): PROBNP in the last 8760 hours. HbA1C: No results for input(s): HGBA1C in the last 72 hours. CBG: No results for input(s): GLUCAP in the last 168 hours. Lipid Profile: No results for input(s): CHOL, HDL, LDLCALC, TRIG, CHOLHDL, LDLDIRECT in the last 72 hours. Thyroid Function Tests: No results for input(s): TSH, T4TOTAL, FREET4, T3FREE, THYROIDAB in the last 72 hours. Anemia Panel: Recent Labs    10/12/19 0300 10/13/19 0250  FERRITIN 374* 289   Sepsis Labs: Recent Labs  Lab 10/08/19 0040 10/09/19 0815 10/12/19 1534  PROCALCITON 0.11 <0.10  --   LATICACIDVEN 0.6  --  0.9    Recent Results (from the past 240 hour(s))  SARS CORONAVIRUS 2 (TAT 6-24 HRS) Nasopharyngeal Nasopharyngeal Swab     Status: Abnormal   Collection Time: 10/08/19 12:40 AM   Specimen: Nasopharyngeal Swab  Result Value Ref Range Status   SARS Coronavirus 2 POSITIVE (A) NEGATIVE Final    Comment: RESULT CALLED TO, READ BACK BY AND VERIFIED WITH: [place] GARRISON RN.@[email or web address]  ON 1.17.21 BY TCALDWELL MT. (NOTE) SARS-CoV-2 target nucleic acids are DETECTED. The SARS-CoV-2 RNA is generally detectable in upper and lower respiratory specimens during the acute phase of  infection. Positive results are indicative of the presence of SARS-CoV-2 RNA. Clinical correlation with patient history and other diagnostic information is  necessary to determine patient infection status. Positive results do not rule out bacterial infection or co-infection with other viruses.  The expected result is Negative. Fact Sheet for Patients: SugarRoll.be Fact Sheet for Healthcare Providers: https://www.woods-mathews.com/ This test is not yet approved or cleared by the [place] FDA and  has been authorized for detection and/or diagnosis of SARS-CoV-2 by FDA under an Emergency Use Authorization (EUA). This EUA will remain  in effect (meaning this test ca n be used) for the duration of the COVID-19 declaration under Section 564(b)(1) of the Act, 21 U.S.C. section 360bbb-3(b)(1), unless the authorization is terminated or revoked sooner. Performed at [facility], [place] [address], [place], [place] [address]   Urine culture     Status: None   Collection Time: 10/08/19 12:42 AM   Specimen: Urine, Catheterized  Result Value Ref Range Status   Specimen Description   Final    URINE, CATHETERIZED Performed at [facility] [address] [place], [place] [address]    Special Requests   Final    Normal Performed at [facility], [place] [address], [place], [place] [address]    Culture   Final    NO GROWTH Performed at [facility], [place] [address], [place], [place] [address]    Report Status 10/09/2019 FINAL  Final  Blood Culture (routine x 2)     Status: None   Collection Time: 10/08/19  1:07 AM   Specimen: BLOOD  Result Value Ref Range Status   Specimen Description   Final    BLOOD RIGHT WRIST Performed at [facility] [address] [place], [place] [address]    Special Requests   Final    BOTTLES DRAWN AEROBIC AND ANAEROBIC Blood Culture adequate  volume Performed at [facility] [address], [place], [place] [address]    Culture   Final    NO GROWTH 5 DAYS Performed at [facility], [place] [address], [place],  [address]    Report Status 10/13/2019 FINAL  Final  Blood Culture (routine x 2)  Status: None   Collection Time: 10/08/19  1:07 AM   Specimen: BLOOD  Result Value Ref Range Status   Specimen Description   Final    BLOOD LEFT ANTECUBITAL Performed at [facility], [place] [address] [place], [place] [address]    Special Requests   Final    BOTTLES DRAWN AEROBIC AND ANAEROBIC Blood Culture results may not be optimal due to an excessive volume of blood received in culture bottles Performed at [facility] [address], [place], [place] [address]    Culture   Final    NO GROWTH 5 DAYS Performed at [facility], [place] [address], [place], [place] [address]    Report Status 10/13/2019 FINAL  Final         Radiology Studies: CT ABDOMEN PELVIS W CONTRAST  Result Date: 10/12/2019 CLINICAL DATA:  Abdominal pain. EXAM: CT ABDOMEN AND PELVIS WITH CONTRAST TECHNIQUE: Multidetector CT imaging of the abdomen and pelvis was performed using the standard protocol following bolus administration of intravenous contrast. CONTRAST:  73mL OMNIPAQUE IOHEXOL 300 MG/ML  SOLN COMPARISON:  October 27, 2014 FINDINGS: Lower chest: Mild scarring and/or atelectasis is seen within the posterior aspect of the bilateral lung bases. Small bilateral pleural effusions are seen. Hepatobiliary: No focal liver abnormality is seen. Status post cholecystectomy. There is mild intra hepatic biliary dilatation with dilatation of the common bile duct. Pancreas: Unremarkable. No pancreatic ductal dilatation or surrounding inflammatory changes. Spleen: Normal in size without focal abnormality. Adrenals/Urinary Tract: Adrenal glands are unremarkable. Kidneys are normal, without renal calculi, focal  lesion, or hydronephrosis. Bladder is unremarkable. Stomach/Bowel: There is a large gastric hernia. The appendix is not identified. No evidence of bowel wall thickening, distention, or inflammatory changes. Vascular/Lymphatic: Marked severity aortic atherosclerosis. An inferior vena cava filter is in place. No enlarged abdominal or pelvic lymph nodes. Reproductive: Status post hysterectomy. No adnexal masses. Other: No abdominal wall hernia or abnormality. No abdominopelvic ascites. Musculoskeletal: Multilevel degenerative changes seen throughout the lumbar spine IMPRESSION: 1. Large gastric hernia. 2. Small bilateral pleural effusions. 3. Evidence of prior cholecystectomy. 4. IVC filter. Aortic Atherosclerosis (ICD10-I70.0). Electronically Signed   By: Virgina Norfolk M.D.   On: 10/12/2019 15:23        Scheduled Meds: . clonazePAM  1 mg Oral QHS  . dexamethasone (DECADRON) injection  6 mg Intravenous Q24H  . heparin  5,000 Units Subcutaneous Q8H  . magnesium citrate  1 Bottle Oral Once  . mouth rinse  15 mL Mouth Rinse BID  . metoprolol succinate  12.5 mg Oral Daily  . pantoprazole (PROTONIX) IV  40 mg Intravenous Q12H  . polyethylene glycol  17 g Oral Daily  . sertraline  100 mg Oral Daily  . sodium chloride flush  3 mL Intravenous Q12H   Continuous Infusions:    LOS: 4 days    Time spent: Spent more than 30 minutes in coordinating care for this patient including bedside patient care.   Lucky Cowboy, MD [facility] If 7PM-7AM, please contact night-coverage 10/13/2019, 4:56 PM

## 2019-10-13 NOTE — Progress Notes (Signed)
Spoke With Tammy and updated.

## 2019-10-14 DIAGNOSIS — E860 Dehydration: Secondary | ICD-10-CM

## 2019-10-14 LAB — MAGNESIUM: Magnesium: 1.8 mg/dL (ref 1.7–2.4)

## 2019-10-14 MED ORDER — GUAIFENESIN-DM 100-10 MG/5ML PO SYRP: 10.0000 mL | ORAL_SOLUTION | ORAL | 0 refills | Status: AC | PRN

## 2019-10-14 MED ORDER — POTASSIUM CHLORIDE CRYS ER 20 MEQ PO TBCR
40.0000 meq | EXTENDED_RELEASE_TABLET | ORAL | Status: AC
  Administered 2019-10-14 (×2): 40 meq via ORAL
  Filled 2019-10-14 (×2): qty 2

## 2019-10-14 MED ORDER — DEXAMETHASONE SODIUM PHOSPHATE 4 MG/ML IJ SOLN
2.0000 mg | INTRAMUSCULAR | Status: DC
  Administered 2019-10-14: 2 mg via INTRAVENOUS
  Filled 2019-10-14: qty 1

## 2019-10-14 NOTE — Discharge Instructions (Signed)
COVID-19: Quarantine vs. Isolation QUARANTINE keeps someone who was in close contact with someone who has COVID-19 away from others. If you had close contact with a person who has COVID-19  Stay home until 14 days after your last contact.  Check your temperature twice a day and watch for symptoms of COVID-19.  If possible, stay away from people who are at higher-risk for getting very sick from COVID-19. ISOLATION keeps someone who is sick or tested positive for COVID-19 without symptoms away from others, even in their own home. If you are sick and think or know you have COVID-19  Stay home until after ? At least 10 days since symptoms first appeared and ? At least 24 hours with no fever without fever-reducing medication and ? Symptoms have improved If you tested positive for COVID-19 but do not have symptoms  Stay home until after ? 10 days have passed since your positive test If you live with others, stay in a specific "sick room" or area and away from other people or animals, including pets. Use a separate bathroom, if available. cdc.gov/coronavirus 04/10/2019 This information is not intended to replace advice given to you by your health care provider. Make sure you discuss any questions you have with your health care provider. Document Revised: 08/24/2019 Document Reviewed: 08/24/2019 Elsevier Patient Education  2020 Else[employer] Inc. COVID-19 Frequently Asked Questions COVID-19 (coronavirus disease) is an infection that is caused by a large family of viruses. Some viruses cause illness in people and others cause illness in animals like camels, cats, and bats. In some cases, the viruses that cause illness in animals can spread to humans. Where did the coronavirus come from? In December 2019, [place] told the World Health Organization (WHO) of several cases of lung disease (human respiratory illness). These cases were linked to an open seafood and livestock market in the city of [place]. The  link to the seafood and livestock market suggests that the virus may have spread from animals to humans. However, since that first outbreak in December, the virus has also been shown to spread from person to person. What is the name of the disease and the virus? Disease name Early on, this disease was called novel coronavirus. This is because scientists determined that the disease was caused by a new (novel) respiratory virus. The World Health Organization (WHO) has now named the disease COVID-19, or coronavirus disease. Virus name The virus that causes the disease is called severe acute respiratory syndrome coronavirus 2 (SARS-CoV-2). More information on disease and virus naming World Health Organization (WHO): www.who.int/emergencies/diseases/novel-coronavirus-2019/technical-guidance/naming-the-coronavirus-disease-(covid-2019)-and-the-virus-that-causes-it Who is at risk for complications from coronavirus disease? Some people may be at higher risk for complications from coronavirus disease. This includes older adults and people who have chronic diseases, such as heart disease, diabetes, and lung disease. If you are at higher risk for complications, take these extra precautions:  Stay home as much as possible.  Avoid social gatherings and travel.  Avoid close contact with others. Stay at least 6 ft (2 m) away from others, if possible.  Wash your hands often with soap and water for at least 20 seconds.  Avoid touching your face, mouth, nose, or eyes.  Keep supplies on hand at home, such as food, medicine, and cleaning supplies.  If you must go out in public, wear a cloth face covering or face mask. Make sure your mask covers your nose and mouth. How does coronavirus disease spread? The virus that causes coronavirus disease spreads easily from person to   person (is contagious). You may catch the virus by:  Breathing in droplets from an infected person. Droplets can be spread by a person  breathing, speaking, singing, coughing, or sneezing.  Touching something, like a table or a doorknob, that was exposed to the virus (contaminated) and then touching your mouth, nose, or eyes. Can I get the virus from touching surfaces or objects? There is still a lot that we do not know about the virus that causes coronavirus disease. Scientists are basing a lot of information on what they know about similar viruses, such as:  Viruses cannot generally survive on surfaces for [place]. They need a human body (host) to survive.  It is more likely that the virus is spread by close contact with people who are sick (direct contact), such as through: ? Shaking hands or hugging. ? Breathing in respiratory droplets that travel through the air. Droplets can be spread by a person breathing, speaking, singing, coughing, or sneezing.  It is less likely that the virus is spread when a person touches a surface or object that has the virus on it (indirect contact). The virus may be able to enter the body if the person touches a surface or object and then touches his or her face, eyes, nose, or mouth. Can a person spread the virus without having symptoms of the disease? It may be possible for the virus to spread before a person has symptoms of the disease, but this is most likely not the main way the virus is spreading. It is more likely for the virus to spread by being in close contact with people who are sick and breathing in the respiratory droplets spread by a person breathing, speaking, singing, coughing, or sneezing. What are the symptoms of coronavirus disease? Symptoms vary from person to person and can range from mild to severe. Symptoms may include:  Fever or chills.  Cough.  Difficulty breathing or feeling short of breath.  Headaches, body aches, or muscle aches.  Runny or stuffy (congested) nose.  Sore throat.  New loss of taste or smell.  Nausea, vomiting, or diarrhea. These symptoms can  appear anywhere from 2 to 14 days after you have been exposed to the virus. Some people may not have any symptoms. If you develop symptoms, call your health care provider. People with severe symptoms may need hospital care. Should I be tested for this virus? Your health care provider will decide whether to test you based on your symptoms, history of exposure, and your risk factors. How does a health care provider test for this virus? Health care providers will collect samples to send for testing. Samples may include:  Taking a swab of fluid from the back of your nose and throat, your nose, or your throat.  Taking fluid from the lungs by having you cough up mucus (sputum) into a sterile cup.  Taking a blood sample. Is there a treatment or vaccine for this virus? Currently, there is no vaccine to prevent coronavirus disease. Also, there are no medicines like antibiotics or antivirals to treat the virus. A person who becomes sick is given supportive care, which means rest and fluids. A person may also relieve his or her symptoms by using over-the-counter medicines that treat sneezing, coughing, and runny nose. These are the same medicines that a person takes for the common cold. If you develop symptoms, call your health care provider. People with severe symptoms may need hospital care. What can I do to protect myself and   my family from this virus?     You can protect yourself and your family by taking the same actions that you would take to prevent the spread of other viruses. Take the following actions:  Wash your hands often with soap and water for at least 20 seconds. If soap and water are not available, use alcohol-based hand sanitizer.  Avoid touching your face, mouth, nose, or eyes.  Cough or sneeze into a tissue, sleeve, or elbow. Do not cough or sneeze into your hand or the air. ? If you cough or sneeze into a tissue, throw it away immediately and wash your hands.  Disinfect objects  and surfaces that you frequently touch every day.  Stay away from people who are sick.  Avoid going out in public, follow guidance from your state and local health authorities.  Avoid crowded indoor spaces. Stay at least 6 ft (2 m) away from others.  If you must go out in public, wear a cloth face covering or face mask. Make sure your mask covers your nose and mouth.  Stay home if you are sick, except to get medical care. Call your health care provider before you get medical care. Your health care provider will tell you how [place] to stay home.  Make sure your vaccines are up to date. Ask your health care provider what vaccines you need. What should I do if I need to travel? Follow travel recommendations from your local health authority, the CDC, and WHO. Travel information and advice  Centers for Disease Control and Prevention (CDC): www.cdc.gov/coronavirus/2019-ncov/travelers/index.html  World Health Organization (WHO): www.who.int/emergencies/diseases/novel-coronavirus-2019/travel-advice Know the risks and take action to protect your health  You are at higher risk of getting coronavirus disease if you are traveling to areas with an outbreak or if you are exposed to travelers from areas with an outbreak.  Wash your hands often and practice good hygiene to lower the risk of catching or spreading the virus. What should I do if I am sick? General instructions to stop the spread of infection  Wash your hands often with soap and water for at least 20 seconds. If soap and water are not available, use alcohol-based hand sanitizer.  Cough or sneeze into a tissue, sleeve, or elbow. Do not cough or sneeze into your hand or the air.  If you cough or sneeze into a tissue, throw it away immediately and wash your hands.  Stay home unless you must get medical care. Call your health care provider or local health authority before you get medical care.  Avoid public areas. Do not take public  transportation, if possible.  If you can, wear a mask if you must go out of the house or if you are in close contact with someone who is not sick. Make sure your mask covers your nose and mouth. Keep your home clean  Disinfect objects and surfaces that are frequently touched every day. This may include: ? Counters and tables. ? Doorknobs and light switches. ? Sinks and faucets. ? Electronics such as phones, remote controls, keyboards, computers, and tablets.  Wash dishes in hot, soapy water or use a dishwasher. Air-dry your dishes.  Wash laundry in hot water. Prevent infecting other household members  Let healthy household members care for children and pets, if possible. If you have to care for children or pets, wash your hands often and wear a mask.  Sleep in a different bedroom or bed, if possible.  Do not share personal items, such as   razors, toothbrushes, deodorant, combs, brushes, towels, and washcloths. Where to find more information Centers for Disease Control and Prevention (CDC)  Information and news updates: www.cdc.gov/coronavirus/2019-ncov World Health Organization (WHO)  Information and news updates: www.who.int/emergencies/diseases/novel-coronavirus-2019  Coronavirus health topic: www.who.int/health-topics/coronavirus  Questions and answers on COVID-19: www.who.int/news-room/q-a-detail/q-a-coronaviruses  Global tracker: who.sprinklr.com American Academy of Pediatrics (AAP)  Information for families: www.healthychildren.org/English/health-issues/conditions/chest-lungs/Pages/2019-Novel-Coronavirus.aspx The coronavirus situation is changing rapidly. Check your local health authority website or the CDC and WHO websites for updates and news. When should I contact a health care provider?  Contact your health care provider if you have symptoms of an infection, such as fever or cough, and you: ? Have been near anyone who is known to have coronavirus disease. ? Have  come into contact with a person who is suspected to have coronavirus disease. ? Have traveled to an area where there is an outbreak of COVID-19. When should I get emergency medical care?  Get help right away by calling your local emergency services (911 in the [place] if you have: ? Trouble breathing. ? Pain or pressure in your chest. ? Confusion. ? Blue-tinged lips and fingernails. ? Difficulty waking from sleep. ? Symptoms that get worse. Let the emergency medical personnel know if you think you have coronavirus disease. Summary  A new respiratory virus is spreading from person to person and causing COVID-19 (coronavirus disease).  The virus that causes COVID-19 appears to spread easily. It spreads from one person to another through droplets from breathing, speaking, singing, coughing, or sneezing.  Older adults and those with chronic diseases are at higher risk of disease. If you are at higher risk for complications, take extra precautions.  There is currently no vaccine to prevent coronavirus disease. There are no medicines, such as antibiotics or antivirals, to treat the virus.  You can protect yourself and your family by washing your hands often, avoiding touching your face, and covering your coughs and sneezes. This information is not intended to replace advice given to you by your health care provider. Make sure you discuss any questions you have with your health care provider. Document Revised: 07/07/2019 Document Reviewed: 01/03/2019 Elsevier Patient Education  2020 Elsevier Inc.  

## 2019-10-14 NOTE — Progress Notes (Signed)
Physical Therapy Treatment Patient Details Name: Brittney Hicks MRN: [id number] DOB: December 22, 1928 Today's Date: 10/14/2019    History of Present Illness 86 year old Caucasian female with PMH of CKD stage III, frequent UTIs on suppressive antibiotics, anxiety, hypertension who tested positive for COVID-19 at an urgent care on 10/06/2019 presented to the ED from home with progressive pathology and fatigue.  At baseline she is independent with ADLs.  While in the ED she was saturating well on room air, otherwise hemodynamically stable.  Chest x-ray normal, procalcitonin negative.  Found to have elevated creatinine of 1.6 with no known prior kidney disease.  Admitted for severe generalized weakness, AKI secondary to COVID-19.    PT Comments    Pt was able to ambulate and transfer with min guard to min A.  Ambulated 33' with RW slowly with cues for RW.  Pt with incontinence, but reports this is baseline and daughter assist with ADLs as needed at baseline.  Reports daughter there for 24 hr support and feels comfortable discharging home with HHPT.  Pt has 24 hr support and necessary DME - recommend home with family and  HHPT to advance mobility.      Follow Up Recommendations  Home health PT;Supervision/Assistance - 24 hour     Equipment Recommendations  None recommended by PT    Recommendations for Other Services       Precautions / Restrictions Precautions Precautions: Fall    Mobility  Bed Mobility Overal bed mobility: Needs Assistance Bed Mobility: Supine to Sit     Supine to sit: Min guard     General bed mobility comments: increased time  Transfers   Equipment used: Rolling walker (2 wheeled) Transfers: Sit to/from Stand Sit to Stand: Min assist         General transfer comment: increased time, cues for hand placement, min assist to rise, performed x 3; toileting ADLs with assist (reports daughter assist at home at times)  Ambulation/Gait Ambulation/Gait assistance: Min  guard Gait Distance (Feet): 60 Feet Assistive device: Rolling walker (2 wheeled) Gait Pattern/deviations: Step-through pattern;Narrow base of support;Trunk flexed Gait velocity: decreased   General Gait Details: cues for RW and position of RW; min guard for safety, no LOB   Stairs             Wheelchair Mobility    Modified Rankin (Stroke Patients Only)       Balance Overall balance assessment: Needs assistance Sitting-balance support: No upper extremity supported;Feet supported Sitting balance-Leahy Scale: Good     Standing balance support: Bilateral upper extremity supported;During functional activity Standing balance-Leahy Scale: Fair Standing balance comment: use of RW or grab bars; during ADLs; standing for 1-2 minutes x2 for toileting ADLs                            Cognition Arousal/Alertness: Awake/alert Behavior During Therapy: WFL for tasks assessed/performed Overall Cognitive Status: Within Functional Limits for tasks assessed                                 General Comments: HOH      Exercises      General Comments General comments (skin integrity, edema, etc.): On RA with O2 sats at 96% or greater.  At arrival pt had BM in bed requiring assist to clean, additionally had another BM while walking.  Reports incontinance at baseline with daughter assisting.  Pertinent Vitals/Pain Pain Assessment: No/denies pain    Home Living                      Prior Function            PT Goals (current goals can now be found in the care plan section) Progress towards PT goals: Progressing toward goals    Frequency    Min 3X/week      PT Plan Current plan remains appropriate    Co-evaluation              AM-PAC PT "6 Clicks" Mobility   Outcome Measure  Help needed turning from your back to your side while in a flat bed without using bedrails?: None Help needed moving from lying on your back to  sitting on the side of a flat bed without using bedrails?: A Little Help needed moving to and from a bed to a chair (including a wheelchair)?: None Help needed standing up from a chair using your arms (e.g., wheelchair or bedside chair)?: A Little Help needed to walk in hospital room?: A Little Help needed climbing 3-5 steps with a railing? : A Little 6 Click Score: 20    End of Session Equipment Utilized During Treatment: Gait belt Activity Tolerance: Patient limited by fatigue Patient left: with call bell/phone within reach;in chair;with chair alarm set Nurse Communication: Mobility status PT Visit Diagnosis: Muscle weakness (generalized) (M62.81);Difficulty in walking, not elsewhere classified (R26.2)     Time: [id number] PT Time Calculation (min) (ACUTE ONLY): 34 min  Charges:  $Gait Training: 8-22 mins $Therapeutic Activity: 8-22 mins                     Maggie Font, PT Acute Rehab Services Pager [phone] [facility] [phone] [facility] [phone]    Karlton Lemon 10/14/2019, 3:13 PM

## 2019-10-14 NOTE — Plan of Care (Signed)
Discharge instructions reviewed with patient and daughter, questions answered, verbalized understanding.  Patient transported to main entrance to be taken home by daughter and son in law.

## 2019-10-14 NOTE — Discharge Summary (Signed)
Physician Discharge Summary  Brittney Hicks [id number] DOB: 1929/01/06 DOA: 10/08/2019  PCP: [name], MD  Admit date: 10/08/2019 Discharge date: 10/14/2019  Admitted From: home  Disposition:  home   Recommendations for Outpatient Follow-up:  1. F/u Cr in 1-2 wks please  Home Health:  ordered    Discharge Condition:  stable   CODE STATUS:  Full code   Diet recommendation:  Heart healthy Consultations:  none    Discharge Diagnoses:  Principal Problem:   COVID-19 virus infection Active Problems: generalized abdominal pain/ constipation  AKI with h/o CKD (chronic kidney disease), stage III   Lower urinary tract infection     Anxiety      Brief Summary: 86 year old Caucasian female with PMH of CKD stage III, frequent UTIs on suppressive antibiotics, anxiety, hypertension who tested positive for COVID-19 at an urgent care on 10/06/2019 presented to the ED from home with progressive pathology and fatigue.  At baseline she is independent with ADLs.  While in the ED she was saturating well on room air, otherwise hemodynamically stable.  Chest x-ray normal, procalcitonin negative.  Found to have elevated creatinine of 1.6.  Admitted for severe generalized weakness, AKI secondary to COVID-19.   Hospital Course:  Acute COVID 19 infection  - cough, fever and weakness- no pneumonia - given Remdesivir and Dexamethasone which she has completed - inflammatory markers improved  Generalized abdominal pain - 1/21> CT abd/pelvis unrevealing - improved after treating constipation- eating and drinking and not having any further complaints.  AKI on CKD 3b - due to poor oral intake- treated with IVF and resolved  Hypokalemia - treated  Recent UTI with h/o recurrent UTIs - treated with Keflex  GERD - cont PPI     Discharge Exam: Vitals:   10/13/19 2142 10/14/19 0307  BP: 126/65 130/65  Pulse: 86 84  Resp:    Temp: 98.8 F (37.1 C) 98.2 F (36.8 C)  SpO2: 92% 94%    Vitals:   10/13/19 0544 10/13/19 0840 10/13/19 2142 10/14/19 0307  BP: 112/65 117/63 126/65 130/65  Pulse: 96 (!) 103 86 84  Resp: 16 20    Temp: 99.1 F (37.3 C) 98.8 F (37.1 C) 98.8 F (37.1 C) 98.2 F (36.8 C)  TempSrc: Oral Axillary Oral Oral  SpO2: 92% 94% 92% 94%  Weight:      Height:        General: Pt is alert, awake, not in acute distress Cardiovascular: RRR, S1/S2 +, no rubs, no gallops Respiratory: CTA bilaterally, no wheezing, no rhonchi Abdominal: Soft, NT, ND, bowel sounds + Extremities: no edema, no cyanosis   Discharge Instructions  Discharge Instructions    Diet - low sodium heart healthy   Complete by: As directed    Increase activity slowly   Complete by: As directed      Allergies as of 10/14/2019      Reactions   Benzocaine Other (See Comments)   Oral blisters   Lovenox [enoxaparin Sodium] Other (See Comments)   Has received this med without issue since it was listed as an allergy   Stadol [butorphanol]    Had a stroke after receiving   Tape Other (See Comments)   blisters   Vistaril [hydroxyzine Hcl]    Had a stroke after receiving   Warfarin And Related Other (See Comments)   Hematoma while on this and lovenox   Demerol [meperidine] Rash   Dilaudid [hydromorphone Hcl] Itching, Rash   Macrobid [nitrofurantoin] Rash  Medication List    STOP taking these medications   cephALEXin 500 MG capsule Commonly known as: KEFLEX   trimethoprim 100 MG tablet Commonly known as: TRIMPEX     TAKE these medications   cetirizine 10 MG tablet Commonly known as: ZYRTEC Take 10 mg by mouth daily.   clonazePAM 1 MG tablet Commonly known as: KLONOPIN Take 1.5 mg by mouth daily.   guaiFENesin-dextromethorphan 100-10 MG/5ML syrup Commonly known as: ROBITUSSIN DM Take 10 mLs by mouth every 4 (four) hours as needed for cough.   metoprolol succinate 25 MG 24 hr tablet Commonly known as: TOPROL-XL Take 0.5 tablets by mouth daily.    mirtazapine 45 MG tablet Commonly known as: REMERON Take 45 mg by mouth at bedtime.   pantoprazole 40 MG tablet Commonly known as: PROTONIX Take 1 tablet by mouth 2 (two) times daily.   rOPINIRole 2 MG tablet Commonly known as: REQUIP Take 7 mg by mouth at bedtime.   sertraline 100 MG tablet Commonly known as: ZOLOFT Take 100 mg by mouth daily.   VITAMIN B-12 IJ Inject 1,000 mcg as directed every 30 (thirty) days.      Follow-up Information    [facility], [facility] Follow up.   Specialty: [place] Why: Please feel free to call [facility] at the above number. Will follow at home with Home health. Contact information: [place] [address] [phone]          Allergies  Allergen Reactions  . Benzocaine Other (See Comments)    Oral blisters  . Lovenox [Enoxaparin Sodium] Other (See Comments)    Has received this med without issue since it was listed as an allergy  . Stadol [Butorphanol]     Had a stroke after receiving  . Tape Other (See Comments)    blisters  . Vistaril [Hydroxyzine Hcl]     Had a stroke after receiving  . Warfarin And Related Other (See Comments)    Hematoma while on this and lovenox  . Demerol [Meperidine] Rash  . Dilaudid [Hydromorphone Hcl] Itching and Rash  . Macrobid [Nitrofurantoin] Rash     Procedures/Studies:   CT ABDOMEN PELVIS W CONTRAST  Result Date: 10/12/2019 CLINICAL DATA:  Abdominal pain. EXAM: CT ABDOMEN AND PELVIS WITH CONTRAST TECHNIQUE: Multidetector CT imaging of the abdomen and pelvis was performed using the standard protocol following bolus administration of intravenous contrast. CONTRAST:  66mL OMNIPAQUE IOHEXOL 300 MG/ML  SOLN COMPARISON:  October 27, 2014 FINDINGS: Lower chest: Mild scarring and/or atelectasis is seen within the posterior aspect of the bilateral lung bases. Small bilateral pleural effusions are seen. Hepatobiliary: No focal liver abnormality is seen. Status  post cholecystectomy. There is mild intra hepatic biliary dilatation with dilatation of the common bile duct. Pancreas: Unremarkable. No pancreatic ductal dilatation or surrounding inflammatory changes. Spleen: Normal in size without focal abnormality. Adrenals/Urinary Tract: Adrenal glands are unremarkable. Kidneys are normal, without renal calculi, focal lesion, or hydronephrosis. Bladder is unremarkable. Stomach/Bowel: There is a large gastric hernia. The appendix is not identified. No evidence of bowel wall thickening, distention, or inflammatory changes. Vascular/Lymphatic: Marked severity aortic atherosclerosis. An inferior vena cava filter is in place. No enlarged abdominal or pelvic lymph nodes. Reproductive: Status post hysterectomy. No adnexal masses. Other: No abdominal wall hernia or abnormality. No abdominopelvic ascites. Musculoskeletal: Multilevel degenerative changes seen throughout the lumbar spine IMPRESSION: 1. Large gastric hernia. 2. Small bilateral pleural effusions. 3. Evidence of prior cholecystectomy. 4. IVC filter.  Aortic Atherosclerosis (ICD10-I70.0). Electronically Signed   By: Virgina [place] M.D.   On: 10/12/2019 15:23   DG Chest Port 1 View  Result Date: 10/08/2019 CLINICAL DATA:  Shortness of breath. COVID positive. EXAM: PORTABLE CHEST 1 VIEW COMPARISON:  Radiograph 01/17/2018, CT 10/27/2014 FINDINGS: Heart is normal in size. Retrocardiac hiatal hernia. No focal airspace disease. No pleural fluid, pulmonary edema or pneumothorax. Bones are diffusely under mineralized. IMPRESSION: No evidence of pneumonia.  Hiatal hernia. Electronically Signed   By: Keith Rake M.D.   On: 10/08/2019 01:24   VAS [place] LOWER EXTREMITY VENOUS (DVT)  Result Date: 10/08/2019  Lower Venous Study Indications: Covid-19, elevated D-Dimer.  Comparison Study: No prior study Performing Technologist: Sharion Dove RVS  Examination Guidelines: A complete evaluation includes B-mode imaging, spectral  Doppler, color Doppler, and power Doppler as needed of all accessible portions of each vessel. Bilateral testing is considered an integral part of a complete examination. Limited examinations for reoccurring indications may be performed as noted.  +---------+---------------+---------+-----------+----------+--------------+ RIGHT    CompressibilityPhasicitySpontaneityPropertiesThrombus Aging +---------+---------------+---------+-----------+----------+--------------+ CFV      Full           Yes      Yes                                 +---------+---------------+---------+-----------+----------+--------------+ SFJ      Full                                                        +---------+---------------+---------+-----------+----------+--------------+ FV Prox  Full                                                        +---------+---------------+---------+-----------+----------+--------------+ FV Mid   Full                                                        +---------+---------------+---------+-----------+----------+--------------+ FV DistalFull                                                        +---------+---------------+---------+-----------+----------+--------------+ PFV      Full                                                        +---------+---------------+---------+-----------+----------+--------------+ POP      Full           Yes      Yes                                 +---------+---------------+---------+-----------+----------+--------------+  PTV      Full                                                        +---------+---------------+---------+-----------+----------+--------------+ PERO     Full                                                        +---------+---------------+---------+-----------+----------+--------------+   +---------+---------------+---------+-----------+----------+--------------+ LEFT      CompressibilityPhasicitySpontaneityPropertiesThrombus Aging +---------+---------------+---------+-----------+----------+--------------+ CFV      Full           Yes      Yes                                 +---------+---------------+---------+-----------+----------+--------------+ SFJ      Full                                                        +---------+---------------+---------+-----------+----------+--------------+ FV Prox  Full                                                        +---------+---------------+---------+-----------+----------+--------------+ FV Mid   Full                                                        +---------+---------------+---------+-----------+----------+--------------+ FV DistalFull                                                        +---------+---------------+---------+-----------+----------+--------------+ PFV      Full                                                        +---------+---------------+---------+-----------+----------+--------------+ POP      Full           Yes      Yes                                 +---------+---------------+---------+-----------+----------+--------------+ PTV      Full                                                        +---------+---------------+---------+-----------+----------+--------------+  PERO     Full                                                        +---------+---------------+---------+-----------+----------+--------------+     Summary: Right: There is no evidence of deep vein thrombosis in the lower extremity. Left: There is no evidence of deep vein thrombosis in the lower extremity.  *See table(s) above for measurements and observations. Electronically signed by Monica Martinez MD on 10/08/2019 at 3:57:24 PM.    Final      The results of significant diagnostics from this hospitalization (including imaging, microbiology, ancillary and laboratory) are  listed below for reference.     Microbiology: Recent Results (from the past 240 hour(s))  SARS CORONAVIRUS 2 (TAT 6-24 HRS) Nasopharyngeal Nasopharyngeal Swab     Status: Abnormal   Collection Time: 10/08/19 12:40 AM   Specimen: Nasopharyngeal Swab  Result Value Ref Range Status   SARS Coronavirus 2 POSITIVE (A) NEGATIVE Final    Comment: RESULT CALLED TO, READ BACK BY AND VERIFIED WITH: [place] GARRISON RN.@1051  ON 1.17.21 BY TCALDWELL MT. (NOTE) SARS-CoV-2 target nucleic acids are DETECTED. The SARS-CoV-2 RNA is generally detectable in upper and lower respiratory specimens during the acute phase of infection. Positive results are indicative of the presence of SARS-CoV-2 RNA. Clinical correlation with patient history and other diagnostic information is  necessary to determine patient infection status. Positive results do not rule out bacterial infection or co-infection with other viruses.  The expected result is Negative. Fact Sheet for Patients: SugarRoll.be Fact Sheet for Healthcare Providers: https://www.woods-mathews.com/ This test is not yet approved or cleared by the [place] FDA and  has been authorized for detection and/or diagnosis of SARS-CoV-2 by FDA under an Emergency Use Authorization (EUA). This EUA will remain  in effect (meaning this test ca n be used) for the duration of the COVID-19 declaration under Section 564(b)(1) of the Act, 21 U.S.C. section 360bbb-3(b)(1), unless the authorization is terminated or revoked sooner. Performed at [facility], [place] [address], [place], [place] [address]   Urine culture     Status: None   Collection Time: 10/08/19 12:42 AM   Specimen: Urine, Catheterized  Result Value Ref Range Status   Specimen Description   Final    URINE, CATHETERIZED Performed at [facility] [address], [place], [place] [address]    Special Requests   Final     Normal Performed at [facility], [place] [address], [place], [place] [address]    Culture   Final    NO GROWTH Performed at [facility], [place] [address], [place], [place] [address]    Report Status 10/09/2019 FINAL  Final  Blood Culture (routine x 2)     Status: None   Collection Time: 10/08/19  1:07 AM   Specimen: BLOOD  Result Value Ref Range Status   Specimen Description   Final    BLOOD RIGHT WRIST Performed at [facility] [address], [place], [place] [address]    Special Requests   Final    BOTTLES DRAWN AEROBIC AND ANAEROBIC Blood Culture adequate volume Performed at [facility] [address], [place], [place] [address]    Culture   Final    NO GROWTH 5 DAYS Performed  at [facility], [place] [address] [place], [place] [address]    Report Status 10/13/2019 FINAL  Final  Blood Culture (routine x 2)     Status: None   Collection Time: 10/08/19  1:07 AM   Specimen: BLOOD  Result Value Ref Range Status   Specimen Description   Final    BLOOD LEFT ANTECUBITAL Performed at [facility], [place] [address], [place], [place] [address]    Special Requests   Final    BOTTLES DRAWN AEROBIC AND ANAEROBIC Blood Culture results may not be optimal due to an excessive volume of blood received in culture bottles Performed at [facility] [address], [place], [place] [address]    Culture   Final    NO GROWTH 5 DAYS Performed at [facility], [place] [address], [place], [place] [address]    Report Status 10/13/2019 FINAL  Final     Labs: BNP (last 3 results) No results for input(s): BNP in the last 8760 hours. Basic Metabolic Panel: Recent Labs  Lab 10/09/19 0348 10/09/19 0348 10/10/19 0319 10/11/19 0248 10/12/19 0300 10/12/19 1534 10/13/19 0250 10/14/19 0818  NA 140   < > 138 139 140 140 141  --   K 3.8   < > 3.4* 4.5 3.6 3.8 3.3*  --   CL 109   < > 109 110 108 107  108  --   CO2 21*   < > 20* 21* 20* 22 21*  --   GLUCOSE 98   < > 97 111* 84 127* 81  --   BUN 27*   < > 28* 31* 30* 27* 25*  --   CREATININE 1.46*   < > 1.30* 1.08* 1.26* 1.24* 1.13*  --   CALCIUM 8.4*   < > 7.9* 8.0* 8.1* 8.1* 7.9*  --   MG 1.9  --   --   --   --   --   --  1.8   < > = values in this interval not displayed.   Liver Function Tests: Recent Labs  Lab 10/10/19 0319 10/11/19 0248 10/12/19 0300 10/12/19 1534 10/13/19 0250  AST 26 23 24  33 29  ALT 16 15 16 22 23   ALKPHOS 37* 32* 36* 46 47  BILITOT 0.4 0.7 0.6 0.3 0.4  PROT 5.9* 5.8* 5.7* 6.0* 5.7*  ALBUMIN 2.8* 2.7* 2.9* 2.9* 2.7*   Recent Labs  Lab 10/12/19 0300  LIPASE 55*   No results for input(s): AMMONIA in the last 168 hours. CBC: Recent Labs  Lab 10/09/19 0348 10/09/19 0348 10/10/19 0319 10/11/19 0248 10/12/19 0300 10/12/19 1534 10/13/19 0250  WBC 4.5   < > 4.6 2.4* 5.1 6.4 5.9  NEUTROABS 3.2  --  3.4 1.3* 3.0  --  3.9  HGB 10.2*   < > 9.5* 9.2* 9.7* 10.3* 9.4*  HCT 32.6*   < > 29.5* 29.3* 30.6* 31.4* 30.3*  MCV 105.2*   < > 104.6* 108.5* 107.0* 103.0* 108.2*  PLT 192   < > 193 176 230 245 247   < > = values in this interval not displayed.   Cardiac Enzymes: Recent Labs  Lab 10/08/19 0440  CKTOTAL 184   BNP: Invalid input(s): POCBNP CBG: No results for input(s): GLUCAP in the last 168 hours. D-Dimer Recent Labs    10/12/19 0300 10/13/19 0250  DDIMER 1.04* 0.87*   Hgb A1c No results for input(s): HGBA1C in the last 72  hours. Lipid Profile No results for input(s): CHOL, HDL, LDLCALC, TRIG, CHOLHDL, LDLDIRECT in the last 72 hours. Thyroid function studies No results for input(s): TSH, T4TOTAL, T3FREE, THYROIDAB in the last 72 hours.  Invalid input(s): FREET3 Anemia work up Recent Labs    10/12/19 0300 10/13/19 0250  FERRITIN 374* 289   Urinalysis    Component Value Date/Time   COLORURINE YELLOW 10/08/2019 0042   APPEARANCEUR CLEAR 10/08/2019 0042   LABSPEC 1.016  10/08/2019 0042   PHURINE 5.0 10/08/2019 0042   GLUCOSEU NEGATIVE 10/08/2019 0042   HGBUR NEGATIVE 10/08/2019 0042   BILIRUBINUR NEGATIVE 10/08/2019 0042   KETONESUR NEGATIVE 10/08/2019 0042   PROTEINUR 30 (A) 10/08/2019 0042   UROBILINOGEN 0.2 07/31/2014 1130   NITRITE NEGATIVE 10/08/2019 0042   LEUKOCYTESUR NEGATIVE 10/08/2019 0042   Sepsis Labs Invalid input(s): PROCALCITONIN,  WBC,  LACTICIDVEN Microbiology Recent Results (from the past 240 hour(s))  SARS CORONAVIRUS 2 (TAT 6-24 HRS) Nasopharyngeal Nasopharyngeal Swab     Status: Abnormal   Collection Time: 10/08/19 12:40 AM   Specimen: Nasopharyngeal Swab  Result Value Ref Range Status   SARS Coronavirus 2 POSITIVE (A) NEGATIVE Final    Comment: RESULT CALLED TO, READ BACK BY AND VERIFIED WITH: [place] GARRISON RN.@1051  ON 1.17.21 BY TCALDWELL MT. (NOTE) SARS-CoV-2 target nucleic acids are DETECTED. The SARS-CoV-2 RNA is generally detectable in upper and lower respiratory specimens during the acute phase of infection. Positive results are indicative of the presence of SARS-CoV-2 RNA. Clinical correlation with patient history and other diagnostic information is  necessary to determine patient infection status. Positive results do not rule out bacterial infection or co-infection with other viruses.  The expected result is Negative. Fact Sheet for Patients: SugarRoll.be Fact Sheet for Healthcare Providers: https://www.woods-mathews.com/ This test is not yet approved or cleared by the [place] FDA and  has been authorized for detection and/or diagnosis of SARS-CoV-2 by FDA under an Emergency Use Authorization (EUA). This EUA will remain  in effect (meaning this test ca n be used) for the duration of the COVID-19 declaration under Section 564(b)(1) of the Act, 21 U.S.C. section 360bbb-3(b)(1), unless the authorization is terminated or revoked sooner. Performed at [facility], [place] [address] [place], [place] [address]   Urine culture     Status: None   Collection Time: 10/08/19 12:42 AM   Specimen: Urine, Catheterized  Result Value Ref Range Status   Specimen Description   Final    URINE, CATHETERIZED Performed at [facility] [address] [place], [place] [address]    Special Requests   Final    Normal Performed at [facility], [place] [address] [place], [place] [address]    Culture   Final    NO GROWTH Performed at [facility], [place] [address] [place], [place] [address]    Report Status 10/09/2019 FINAL  Final  Blood Culture (routine x 2)     Status: None   Collection Time: 10/08/19  1:07 AM   Specimen: BLOOD  Result Value Ref Range Status   Specimen Description   Final    BLOOD RIGHT WRIST Performed at [facility] [address], [place], [place] [address]    Special Requests   Final    BOTTLES DRAWN AEROBIC AND ANAEROBIC Blood Culture adequate volume Performed at [facility] [address] [place], [place] [address]    Culture   Final    NO GROWTH  5 DAYS Performed at [facility], [place] [address], [place], [place] [address]    Report Status 10/13/2019 FINAL  Final  Blood Culture (routine x 2)     Status: None   Collection Time: 10/08/19  1:07 AM   Specimen: BLOOD  Result Value Ref Range Status   Specimen Description   Final    BLOOD LEFT ANTECUBITAL Performed at [facility], [place] [address] [place], [place] [address]    Special Requests   Final    BOTTLES DRAWN AEROBIC AND ANAEROBIC Blood Culture results may not be optimal due to an excessive volume of blood received in culture bottles Performed at [facility] [address], [place], [place] [address]    Culture   Final    NO GROWTH 5 DAYS Performed at [facility], [place] [address] [place], [place] [address]    Report Status 10/13/2019 FINAL   Final     Time coordinating discharge in minutes: 76  SIGNED:   Debbe Odea, MD  [facility] 10/14/2019, 1:25 PM Pager   If 7PM-7AM, please contact night-coverage [email or web address] Password [email or web address]

## 2019-10-14 NOTE — Progress Notes (Signed)
Occupational Therapy Treatment Patient Details Name: Brittney Hicks MRN: [id number] DOB: August 15, 1929 Today's Date: 10/14/2019    History of present illness 86 year old Caucasian female with PMH of CKD stage III, frequent UTIs on suppressive antibiotics, anxiety, hypertension who tested positive for COVID-19 at an urgent care on 10/06/2019 presented to the ED from home with progressive pathology and fatigue.  At baseline she is independent with ADLs.  While in the ED she was saturating well on room air, otherwise hemodynamically stable.  Chest x-ray normal, procalcitonin negative.  Found to have elevated creatinine of 1.6 with no known prior kidney disease.  Admitted for severe generalized weakness, AKI secondary to COVID-19.   OT comments  Pt progressing towards OT goals this session, overlapped with PT this session, max A for peri care after BM, min A for transfers with RW, fatigue continues to limit her ability. OT focused on energy conservation education as Pt is set to dc home today, and education for use of 3 in 1 not only as BSC but shower chair. Pt verbalized understanding. Originally OT recommending SNF - but updated to [facility] to maximize safety and independence in ADL and functional transfers - and also incorporate IADL into therapy sessions.    Follow Up Recommendations  Home health OT;Supervision/Assistance - 24 hour    Equipment Recommendations  3 in 1 bedside commode    Recommendations for Other Services      Precautions / Restrictions Precautions Precautions: Fall Restrictions Weight Bearing Restrictions: No       Mobility Bed Mobility Overal bed mobility: Needs Assistance Bed Mobility: Supine to Sit     Supine to sit: Min guard     General bed mobility comments: Pt OOB in recliner  Transfers Overall transfer level: Needs assistance Equipment used: Rolling walker (2 wheeled) Transfers: Sit to/from Stand Sit to Stand: Min assist         General transfer  comment: increased time, cues for hand placement, min assist to rise    Balance Overall balance assessment: Needs assistance Sitting-balance support: No upper extremity supported;Feet supported Sitting balance-Leahy Scale: Good     Standing balance support: Bilateral upper extremity supported;During functional activity Standing balance-Leahy Scale: Fair Standing balance comment: use of RW/grab bars/sink during ADLs; standing for 1-2 minutes x2 for toileting ADLs                           ADL either performed or assessed with clinical judgement   ADL Overall ADL's : Needs assistance/impaired     Grooming: Wash/dry hands;Wash/dry face;Oral care;Min guard;Standing;Cueing for compensatory techniques Grooming Details (indicate cue type and reason): energy conservation education completed and                  Toilet Transfer: Minimal assistance;Stand-pivot;BSC;RW   Toileting- Clothing Manipulation and Hygiene: Maximal assistance Toileting - Clothing Manipulation Details (indicate cue type and reason): Pt able to stand with RW and OT assisted with performing peri care with warm wash cloth     Functional mobility during ADLs: Minimal assistance;Cueing for safety;Cueing for sequencing;Rolling walker General ADL Comments: energy conservation education completed, and focus since Pt is going home     Vision       Perception     Praxis      Cognition Arousal/Alertness: Awake/alert Behavior During Therapy: WFL for tasks assessed/performed Overall Cognitive Status: Within Functional Limits for tasks assessed  General Comments: HOH        Exercises     Shoulder Instructions       General Comments RA throughout with VSS    Pertinent Vitals/ Pain       Pain Assessment: No/denies pain Pain Intervention(s): Monitored during session  Home Living                                          Prior  Functioning/Environment              Frequency  Min 2X/week        Progress Toward Goals  OT Goals(current goals can now be found in the care plan section)  Progress towards OT goals: Progressing toward goals  Acute Rehab OT Goals Patient Stated Goal: return to PLOF OT Goal Formulation: With patient Time For Goal Achievement: 10/24/19 Potential to Achieve Goals: Good  Plan Discharge plan needs to be updated    Co-evaluation    PT/OT/SLP Co-Evaluation/Treatment: Yes(overlapped) Reason for Co-Treatment: Other (comment)(safety, and Pt discharging home) PT goals addressed during session: Mobility/safety with mobility;Balance;Proper use of DME OT goals addressed during session: ADL's and self-care;Strengthening/ROM;Proper use of Adaptive equipment and DME      AM-PAC OT "6 Clicks" Daily Activity     Outcome Measure   Help from another person eating meals?: None Help from another person taking care of personal grooming?: A Little Help from another person toileting, which includes using toliet, bedpan, or urinal?: A Lot Help from another person bathing (including washing, rinsing, drying)?: A Lot Help from another person to put on and taking off regular upper body clothing?: A Lot Help from another person to put on and taking off regular lower body clothing?: A Lot 6 Click Score: 15    End of Session Equipment Utilized During Treatment: Rolling walker  OT Visit Diagnosis: Muscle weakness (generalized) (M62.81);Unsteadiness on feet (R26.81)   Activity Tolerance Patient limited by fatigue   Patient Left in chair;with call bell/phone within reach   Nurse Communication Mobility status        Time: [id number] OT Time Calculation (min): 25 min  Charges: OT General Charges $OT Visit: 1 Visit OT Treatments $Self Care/Home Management : 8-22 mins  Jesse Sans OTR/L Acute Rehabilitation Services Pager: [phone] Office: [place] 10/14/2019,  3:31 PM

## 2021-04-12 IMAGING — DX DG CHEST 1V PORT
1 series · 1 of 1 positions shown · non-contrast
Comparison: Radiograph 01/17/2018, CT 10/27/2014

CLINICAL DATA: Shortness of breath. COVID positive.

EXAM:
PORTABLE CHEST 1 VIEW

[chest ap]
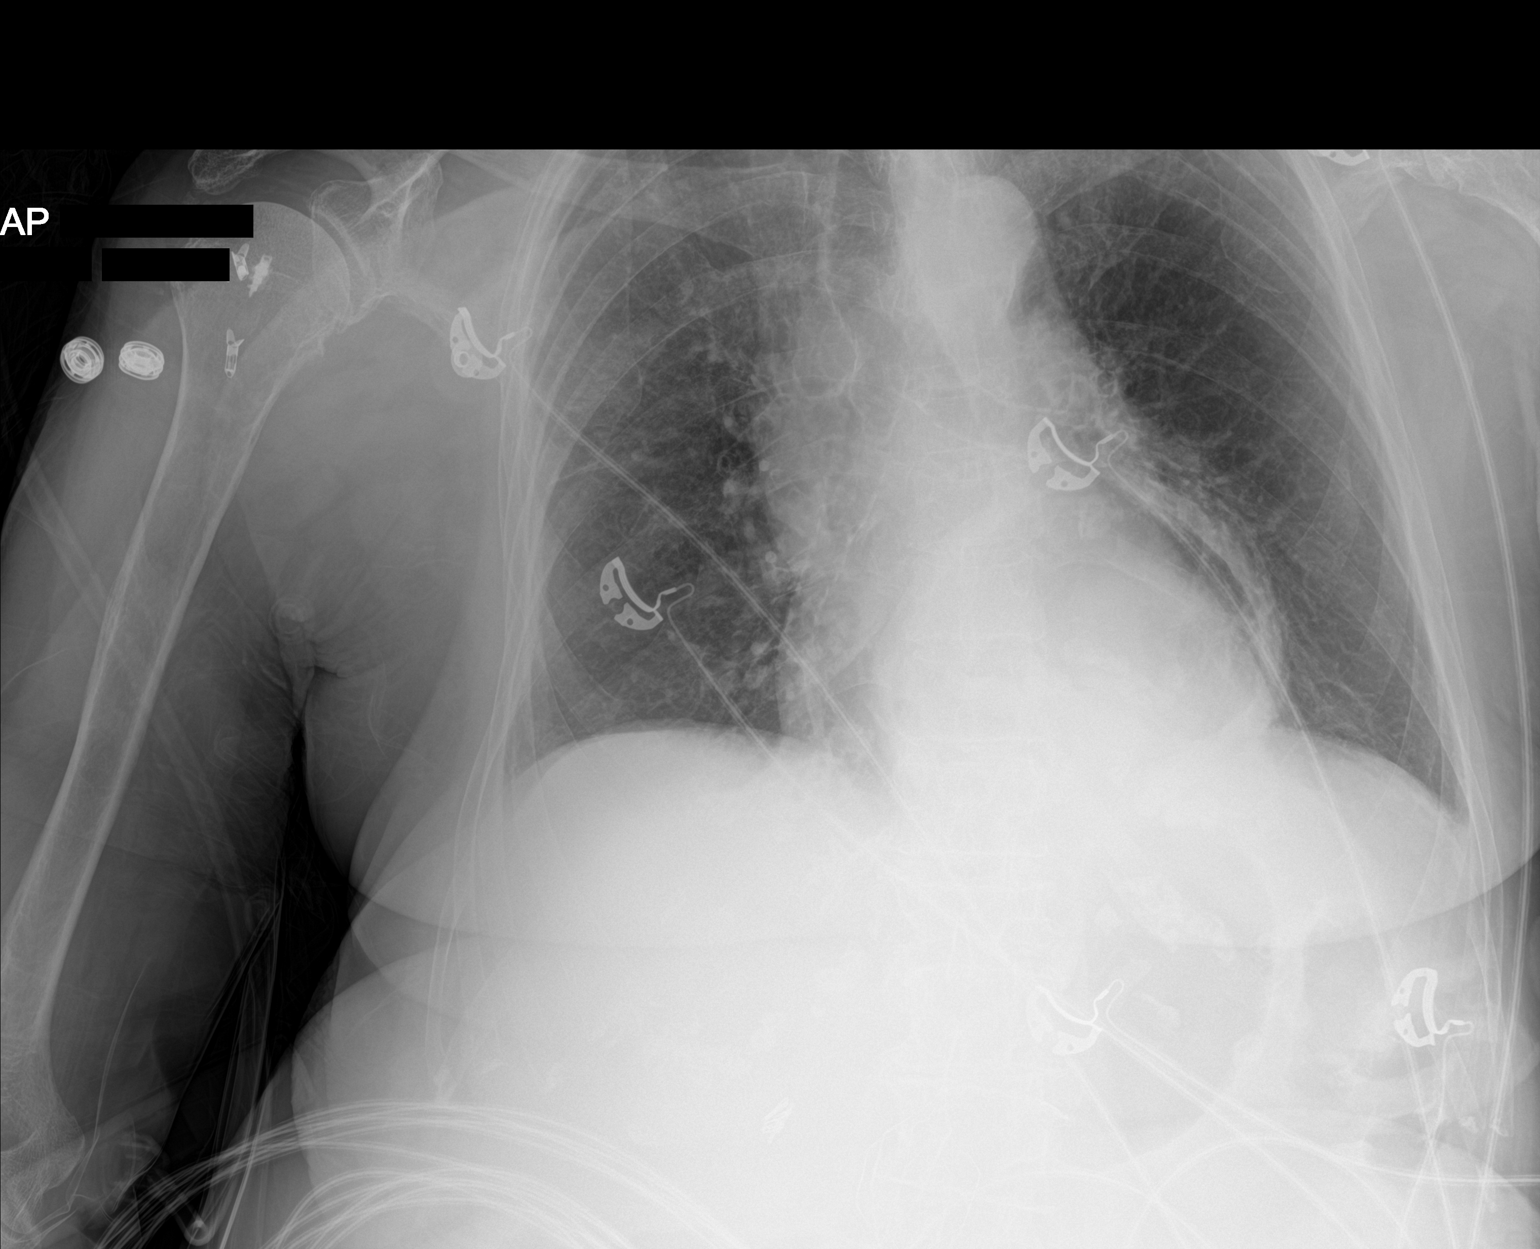

[1 of 1 positions shown; findings below may reference images not displayed]

FINDINGS: Heart is normal in size. Retrocardiac hiatal hernia. No focal
airspace disease. No pleural fluid, pulmonary edema or pneumothorax.
Bones are diffusely under mineralized.
IMPRESSION: No evidence of pneumonia.  Hiatal hernia.

## 2022-02-14 ENCOUNTER — Other Ambulatory Visit: Payer: Self-pay

## 2022-02-14 ENCOUNTER — Emergency Department (HOSPITAL_BASED_OUTPATIENT_CLINIC_OR_DEPARTMENT_OTHER): Payer: Medicare PPO

## 2022-02-14 ENCOUNTER — Encounter (HOSPITAL_BASED_OUTPATIENT_CLINIC_OR_DEPARTMENT_OTHER): Payer: Self-pay | Admitting: Emergency Medicine

## 2022-02-14 ENCOUNTER — Observation Stay (HOSPITAL_BASED_OUTPATIENT_CLINIC_OR_DEPARTMENT_OTHER)
Admission: EM | Admit: 2022-02-14 | Discharge: 2022-02-15 | Disposition: A | Discharge status: Home Health | Payer: Medicare PPO | Attending: Internal Medicine | Admitting: Internal Medicine

## 2022-02-14 DIAGNOSIS — J90 Pleural effusion, not elsewhere classified: Secondary | ICD-10-CM | POA: Diagnosis present

## 2022-02-14 DIAGNOSIS — Y92009 Unspecified place in unspecified non-institutional (private) residence as the place of occurrence of the external cause: Secondary | ICD-10-CM

## 2022-02-14 DIAGNOSIS — N183 Chronic kidney disease, stage 3 unspecified: Secondary | ICD-10-CM | POA: Diagnosis present

## 2022-02-14 DIAGNOSIS — R2689 Other abnormalities of gait and mobility: Secondary | ICD-10-CM | POA: Insufficient documentation

## 2022-02-14 DIAGNOSIS — Z8673 Personal history of transient ischemic attack (TIA), and cerebral infarction without residual deficits: Secondary | ICD-10-CM | POA: Diagnosis not present

## 2022-02-14 DIAGNOSIS — S2242XA Multiple fractures of ribs, left side, initial encounter for closed fracture: Principal | ICD-10-CM | POA: Insufficient documentation

## 2022-02-14 DIAGNOSIS — R1310 Dysphagia, unspecified: Secondary | ICD-10-CM | POA: Insufficient documentation

## 2022-02-14 DIAGNOSIS — I10 Essential (primary) hypertension: Secondary | ICD-10-CM | POA: Diagnosis not present

## 2022-02-14 DIAGNOSIS — Z79899 Other long term (current) drug therapy: Secondary | ICD-10-CM | POA: Insufficient documentation

## 2022-02-14 DIAGNOSIS — I129 Hypertensive chronic kidney disease with stage 1 through stage 4 chronic kidney disease, or unspecified chronic kidney disease: Secondary | ICD-10-CM | POA: Insufficient documentation

## 2022-02-14 DIAGNOSIS — W010XXA Fall on same level from slipping, tripping and stumbling without subsequent striking against object, initial encounter: Secondary | ICD-10-CM | POA: Diagnosis not present

## 2022-02-14 DIAGNOSIS — Z7901 Long term (current) use of anticoagulants: Secondary | ICD-10-CM | POA: Insufficient documentation

## 2022-02-14 DIAGNOSIS — Z85828 Personal history of other malignant neoplasm of skin: Secondary | ICD-10-CM | POA: Insufficient documentation

## 2022-02-14 DIAGNOSIS — F0390 Unspecified dementia without behavioral disturbance: Secondary | ICD-10-CM | POA: Diagnosis not present

## 2022-02-14 DIAGNOSIS — S2249XA Multiple fractures of ribs, unspecified side, initial encounter for closed fracture: Secondary | ICD-10-CM | POA: Diagnosis present

## 2022-02-14 DIAGNOSIS — N1832 Chronic kidney disease, stage 3b: Secondary | ICD-10-CM | POA: Diagnosis not present

## 2022-02-14 DIAGNOSIS — Z86718 Personal history of other venous thrombosis and embolism: Secondary | ICD-10-CM

## 2022-02-14 DIAGNOSIS — S42302A Unspecified fracture of shaft of humerus, left arm, initial encounter for closed fracture: Secondary | ICD-10-CM | POA: Insufficient documentation

## 2022-02-14 DIAGNOSIS — W19XXXA Unspecified fall, initial encounter: Secondary | ICD-10-CM

## 2022-02-14 DIAGNOSIS — Z20822 Contact with and (suspected) exposure to covid-19: Secondary | ICD-10-CM | POA: Insufficient documentation

## 2022-02-14 DIAGNOSIS — R0789 Other chest pain: Secondary | ICD-10-CM | POA: Diagnosis present

## 2022-02-14 LAB — COMPREHENSIVE METABOLIC PANEL WITH GFR
ALT: 9 U/L (ref 0–44)
AST: 12 U/L — ABNORMAL LOW (ref 15–41)
Albumin: 3.2 g/dL — ABNORMAL LOW (ref 3.5–5.0)
Alkaline Phosphatase: 63 U/L (ref 38–126)
Anion gap: 7 (ref 5–15)
BUN: 40 mg/dL — ABNORMAL HIGH (ref 8–23)
CO2: 21 mmol/L — ABNORMAL LOW (ref 22–32)
Calcium: 8.6 mg/dL — ABNORMAL LOW (ref 8.9–10.3)
Chloride: 111 mmol/L (ref 98–111)
Creatinine, Ser: 2.16 mg/dL — ABNORMAL HIGH (ref 0.44–1.00)
GFR, Estimated: 21 mL/min — ABNORMAL LOW
Glucose, Bld: 87 mg/dL (ref 70–99)
Potassium: 4.3 mmol/L (ref 3.5–5.1)
Sodium: 139 mmol/L (ref 135–145)
Total Bilirubin: 0.3 mg/dL (ref 0.3–1.2)
Total Protein: 6.9 g/dL (ref 6.5–8.1)

## 2022-02-14 LAB — MAGNESIUM: Magnesium: 2.2 mg/dL (ref 1.7–2.4)

## 2022-02-14 LAB — CBC WITH DIFFERENTIAL/PLATELET
Abs Immature Granulocytes: 0.01 10*3/uL (ref 0.00–0.07)
Basophils Absolute: 0 10*3/uL (ref 0.0–0.1)
Basophils Relative: 1 %
Eosinophils Absolute: 0.1 10*3/uL (ref 0.0–0.5)
Eosinophils Relative: 4 %
HCT: 25.7 % — ABNORMAL LOW (ref 36.0–46.0)
Hemoglobin: 8.2 g/dL — ABNORMAL LOW (ref 12.0–15.0)
Immature Granulocytes: 0 %
Lymphocytes Relative: 33 %
Lymphs Abs: 1.3 10*3/uL (ref 0.7–4.0)
MCH: 31.2 pg (ref 26.0–34.0)
MCHC: 31.9 g/dL (ref 30.0–36.0)
MCV: 97.7 fL (ref 80.0–100.0)
Monocytes Absolute: 0.5 10*3/uL (ref 0.1–1.0)
Monocytes Relative: 12 %
Neutro Abs: 2 10*3/uL (ref 1.7–7.7)
Neutrophils Relative %: 50 %
Platelets: 253 10*3/uL (ref 150–400)
RBC: 2.63 MIL/uL — ABNORMAL LOW (ref 3.87–5.11)
RDW: 13.5 % (ref 11.5–15.5)
WBC: 4 10*3/uL (ref 4.0–10.5)
nRBC: 0 % (ref 0.0–0.2)

## 2022-02-14 LAB — URINALYSIS, COMPLETE (UACMP) WITH MICROSCOPIC
Bacteria, UA: NONE SEEN
Bilirubin Urine: NEGATIVE
Glucose, UA: NEGATIVE mg/dL
Hgb urine dipstick: NEGATIVE
Ketones, ur: NEGATIVE mg/dL
Leukocytes,Ua: NEGATIVE
Nitrite: NEGATIVE
Protein, ur: NEGATIVE mg/dL
Specific Gravity, Urine: 1.012 (ref 1.005–1.030)
pH: 5 (ref 5.0–8.0)

## 2022-02-14 LAB — TYPE AND SCREEN
ABO/RH(D): O POS
Antibody Screen: NEGATIVE

## 2022-02-14 LAB — IRON AND TIBC
Iron: 27 ug/dL — ABNORMAL LOW (ref 28–170)
Saturation Ratios: 9 % — ABNORMAL LOW (ref 10.4–31.8)
TIBC: 297 ug/dL (ref 250–450)
UIBC: 270 ug/dL

## 2022-02-14 LAB — CBC
HCT: 26.1 % — ABNORMAL LOW (ref 36.0–46.0)
Hemoglobin: 8.1 g/dL — ABNORMAL LOW (ref 12.0–15.0)
MCH: 30.8 pg (ref 26.0–34.0)
MCHC: 31 g/dL (ref 30.0–36.0)
MCV: 99.2 fL (ref 80.0–100.0)
Platelets: 259 10*3/uL (ref 150–400)
RBC: 2.63 MIL/uL — ABNORMAL LOW (ref 3.87–5.11)
RDW: 13.5 % (ref 11.5–15.5)
WBC: 3.9 10*3/uL — ABNORMAL LOW (ref 4.0–10.5)
nRBC: 0 % (ref 0.0–0.2)

## 2022-02-14 LAB — PROTIME-INR
INR: 1.2 (ref 0.8–1.2)
Prothrombin Time: 14.8 s (ref 11.4–15.2)

## 2022-02-14 LAB — CREATININE, URINE, RANDOM: Creatinine, Urine: 61.34 mg/dL

## 2022-02-14 LAB — BASIC METABOLIC PANEL
Anion gap: 8 (ref 5–15)
BUN: 36 mg/dL — ABNORMAL HIGH (ref 8–23)
CO2: 22 mmol/L (ref 22–32)
Calcium: 8.8 mg/dL — ABNORMAL LOW (ref 8.9–10.3)
Chloride: 112 mmol/L — ABNORMAL HIGH (ref 98–111)
Creatinine, Ser: 2.18 mg/dL — ABNORMAL HIGH (ref 0.44–1.00)
GFR, Estimated: 21 mL/min — ABNORMAL LOW (ref >60)
Glucose, Bld: 92 mg/dL (ref 70–99)
Potassium: 4.2 mmol/L (ref 3.5–5.1)
Sodium: 142 mmol/L (ref 135–145)

## 2022-02-14 LAB — CK: Total CK: 41 U/L (ref 38–234)

## 2022-02-14 LAB — RETICULOCYTES
Immature Retic Fract: 11.3 % (ref 2.3–15.9)
RBC.: 2.6 MIL/uL — ABNORMAL LOW (ref 3.87–5.11)
Retic Count, Absolute: 32.5 10*3/uL (ref 19.0–186.0)
Retic Ct Pct: 1.3 % (ref 0.4–3.1)

## 2022-02-14 LAB — VITAMIN B12: Vitamin B-12: 780 pg/mL (ref 180–914)

## 2022-02-14 LAB — SARS CORONAVIRUS 2 BY RT PCR: SARS Coronavirus 2 by RT PCR: NEGATIVE

## 2022-02-14 LAB — FOLATE: Folate: 12.8 ng/mL (ref >5.9)

## 2022-02-14 LAB — PHOSPHORUS: Phosphorus: 4.2 mg/dL (ref 2.5–4.6)

## 2022-02-14 LAB — TROPONIN I (HIGH SENSITIVITY): Troponin I (High Sensitivity): 9 ng/L (ref <18)

## 2022-02-14 LAB — OSMOLALITY, URINE: Osmolality, Ur: 486 mOsm/kg (ref 300–900)

## 2022-02-14 LAB — SODIUM, URINE, RANDOM: Sodium, Ur: 128 mmol/L

## 2022-02-14 LAB — FERRITIN: Ferritin: 54 ng/mL (ref 11–307)

## 2022-02-14 MED ORDER — QUETIAPINE FUMARATE 50 MG PO TABS
25.0000 mg | ORAL_TABLET | Freq: Every day | ORAL | Status: DC
  Administered 2022-02-14: 25 mg via ORAL
  Filled 2022-02-14: qty 1

## 2022-02-14 MED ORDER — MIRTAZAPINE 15 MG PO TABS
45.0000 mg | ORAL_TABLET | Freq: Every day | ORAL | Status: DC
  Administered 2022-02-14: 45 mg via ORAL
  Filled 2022-02-14: qty 3

## 2022-02-14 MED ORDER — ACETAMINOPHEN 325 MG PO TABS: 650.0000 mg | ORAL_TABLET | Freq: Four times a day (QID) | ORAL | Status: DC | PRN

## 2022-02-14 MED ORDER — PANTOPRAZOLE SODIUM 40 MG PO TBEC
40.0000 mg | DELAYED_RELEASE_TABLET | Freq: Two times a day (BID) | ORAL | Status: DC
  Administered 2022-02-14 – 2022-02-15 (×2): 40 mg via ORAL
  Filled 2022-02-14 (×2): qty 1

## 2022-02-14 MED ORDER — MORPHINE SULFATE (PF) 2 MG/ML IV SOLN
2.0000 mg | Freq: Once | INTRAVENOUS | Status: AC
  Administered 2022-02-14: 2 mg via INTRAVENOUS
  Filled 2022-02-14: qty 1

## 2022-02-14 MED ORDER — FENTANYL CITRATE PF 50 MCG/ML IJ SOSY: 25.0000 ug | PREFILLED_SYRINGE | INTRAMUSCULAR | Status: DC | PRN

## 2022-02-14 MED ORDER — SERTRALINE HCL 100 MG PO TABS
100.0000 mg | ORAL_TABLET | Freq: Every day | ORAL | Status: DC
Start: 2022-02-15 — End: 2022-02-15
  Administered 2022-02-15: 100 mg via ORAL
  Filled 2022-02-14: qty 1

## 2022-02-14 MED ORDER — CLONAZEPAM 0.5 MG PO TABS
1.5000 mg | ORAL_TABLET | Freq: Every day | ORAL | Status: DC
  Administered 2022-02-15: 1.5 mg via ORAL
  Filled 2022-02-14: qty 3

## 2022-02-14 MED ORDER — METOPROLOL SUCCINATE ER 25 MG PO TB24
12.5000 mg | ORAL_TABLET | Freq: Every day | ORAL | Status: DC
  Administered 2022-02-15: 12.5 mg via ORAL
  Filled 2022-02-14: qty 1

## 2022-02-14 MED ORDER — GUAIFENESIN ER 600 MG PO TB12
600.0000 mg | ORAL_TABLET | Freq: Two times a day (BID) | ORAL | Status: DC
  Administered 2022-02-14 – 2022-02-15 (×2): 600 mg via ORAL
  Filled 2022-02-14 (×2): qty 1

## 2022-02-14 MED ORDER — ALBUTEROL SULFATE (2.5 MG/3ML) 0.083% IN NEBU: 2.5000 mg | INHALATION_SOLUTION | RESPIRATORY_TRACT | Status: DC | PRN

## 2022-02-14 MED ORDER — ROPINIROLE HCL 1 MG PO TABS
7.0000 mg | ORAL_TABLET | Freq: Every day | ORAL | Status: DC
  Administered 2022-02-14: 7 mg via ORAL
  Filled 2022-02-14 (×2): qty 7

## 2022-02-14 MED ORDER — SODIUM CHLORIDE 0.9 % IV SOLN
75.0000 mL/h | INTRAVENOUS | Status: DC
  Administered 2022-02-14: 75 mL/h via INTRAVENOUS

## 2022-02-14 MED ORDER — ACETAMINOPHEN 650 MG RE SUPP: 650.0000 mg | Freq: Four times a day (QID) | RECTAL | Status: DC | PRN

## 2022-02-14 MED ORDER — ONDANSETRON HCL 4 MG/2ML IJ SOLN
4.0000 mg | Freq: Once | INTRAMUSCULAR | Status: AC
  Administered 2022-02-14: 4 mg via INTRAVENOUS
  Filled 2022-02-14: qty 2

## 2022-02-14 NOTE — Assessment & Plan Note (Signed)
History of DVT will hold off on Eliquis for right now patient is status post IVC filter

## 2022-02-14 NOTE — ED Provider Notes (Signed)
[place] EMERGENCY DEPARTMENT Provider Note   CSN: [id number] Arrival date & time: 02/14/22  1224     History  Chief Complaint  Patient presents with   Brittney Hicks is a 88 y.o. female.  88 yo F with a cc of left-sided chest pain.  This occurred after she had a fall about 11 days ago.  Had been seen in urgent care and was diagnosed with a pneumonia and was started on antibiotics.  Had finished the antibiotics but is still having excruciating pain.  For like it got a little bit worse overnight last night.  No fevers no cough.   Fall      Home Medications Prior to Admission medications   Medication Sig Start Date End Date Taking? Authorizing Provider  cetirizine (ZYRTEC) 10 MG tablet Take 10 mg by mouth daily.    [provider]  clonazePAM (KLONOPIN) 1 MG tablet Take 1.5 mg by mouth daily.     [provider]  Cyanocobalamin (VITAMIN B-12 IJ) Inject 1,000 mcg as directed every 30 (thirty) days.    [provider]  guaiFENesin-dextromethorphan (ROBITUSSIN DM) 100-10 MG/5ML syrup Take 10 mLs by mouth every 4 (four) hours as needed for cough. 10/14/19   Debbe Odea, MD  metoprolol succinate (TOPROL-XL) 25 MG 24 hr tablet Take 0.5 tablets by mouth daily. 08/01/19   [provider]  mirtazapine (REMERON) 45 MG tablet Take 45 mg by mouth at bedtime. 08/01/19   [provider]  pantoprazole (PROTONIX) 40 MG tablet Take 1 tablet by mouth 2 (two) times daily. 08/01/19   [provider]  rOPINIRole (REQUIP) 2 MG tablet Take 7 mg by mouth at bedtime.     [provider]  sertraline (ZOLOFT) 100 MG tablet Take 100 mg by mouth daily.    [provider]      Allergies    Benzocaine, Lovenox [enoxaparin sodium], Stadol [butorphanol], Tape, Vistaril [hydroxyzine hcl], Warfarin and related, Demerol [meperidine], Dilaudid [hydromorphone hcl], and Macrobid [nitrofurantoin]    Review of Systems    Review of Systems  Physical Exam Updated Vital Signs BP 139/86   Pulse 78   Temp 98.2 F (36.8 C) (Oral)   Resp 17   Ht '5\' 2"'$  (1.575 m)   Wt 55.3 kg   SpO2 93%   BMI 22.31 kg/m  Physical Exam Vitals and nursing note reviewed.  Constitutional:      General: She is not in acute distress.    Appearance: She is well-developed. She is not diaphoretic.  HENT:     Head: Normocephalic and atraumatic.  Eyes:     Pupils: Pupils are equal, round, and reactive to light.  Cardiovascular:     Rate and Rhythm: Normal rate and regular rhythm.     Heart sounds: No murmur heard.   No friction rub. No gallop.  Pulmonary:     Effort: Pulmonary effort is normal.     Breath sounds: No wheezing or rales.  Abdominal:     General: There is no distension.     Palpations: Abdomen is soft.     Tenderness: There is no abdominal tenderness.  Musculoskeletal:        General: No tenderness.     Cervical back: Normal range of motion and neck supple.     Comments: She has old appearing bruise to the right knee.  No obvious trauma to the left chest wall.  Pain about the left anterior rib  margin ribs 4 through 6.  Skin:    General: Skin is warm and dry.  Neurological:     Mental Status: She is alert and oriented to person, place, and time.  Psychiatric:        Behavior: Behavior normal.    ED Results / Procedures / Treatments   Labs (all labs ordered are listed, but only abnormal results are displayed) Labs Reviewed  CBC WITH DIFFERENTIAL/PLATELET - Abnormal; Notable for the following components:      Result Value   RBC 2.63 (*)    Hemoglobin 8.2 (*)    HCT 25.7 (*)    All other components within normal limits  COMPREHENSIVE METABOLIC PANEL - Abnormal; Notable for the following components:   CO2 21 (*)    BUN 40 (*)    Creatinine, Ser 2.16 (*)    Calcium 8.6 (*)    Albumin 3.2 (*)    AST 12 (*)    GFR, Estimated 21 (*)    All other components within normal limits  TROPONIN I (HIGH  SENSITIVITY)    EKG EKG Interpretation  Date/Time:  Saturday Feb 14 2022 13:00:08 EDT Ventricular Rate:  79 PR Interval:  158 QRS Duration: 90 QT Interval:  404 QTC Calculation: 464 R Axis:   4 Text Interpretation: Sinus rhythm No significant change since last tracing Confirmed by Deno Etienne [phone]) on 02/14/2022 1:05:38 PM  Radiology CT Chest Wo Contrast  Result Date: 02/14/2022 CLINICAL DATA:  Blunt chest trauma.  Frequent falls. EXAM: CT CHEST WITHOUT CONTRAST TECHNIQUE: Multidetector CT imaging of the chest was performed following the standard protocol without IV contrast. RADIATION DOSE REDUCTION: This exam was performed according to the departmental dose-optimization program which includes automated exposure control, adjustment of the mA and/or kV according to patient size and/or use of iterative reconstruction technique. COMPARISON:  Chest radiograph performed earlier on the same date. FINDINGS: Cardiovascular: Normal heart size. No pericardial effusion. Mild coronary artery atherosclerotic calcifications. Aorta is normal in caliber with atherosclerotic calcification of the aortic arch. Mediastinum/Nodes: No enlarged mediastinal or axillary lymph nodes. Thyroid gland, trachea, and esophagus demonstrate no significant findings. Large hiatal hernia with intrathoracic stomach. Lungs/Pleura: Moderate size left pleural effusion. Left basilar atelectasis secondary to large hiatal hernia. Mild bilateral lower lobe fibrosis/dependent atelectasis. No evidence of pneumonia or pulmonary edema. Upper Abdomen: Status post cholecystectomy. Partially imaged IVC filter in place. Large hiatal hernia with intrathoracic stomach. Musculoskeletal: Mildly displaced fractures of the left 5-8th ribs. Osteopenia and moderate thoracic spondylosis. Chronic compression deformity of L1 vertebral body. IMPRESSION: 1.  Mildly displaced fractures of the left 5-8th ribs. 2.  Moderate left pleural effusion with left basilar  atelectasis. 3.  Large hiatal hernia with intrathoracic stomach. 4.  Chronic compression deformity of the L1 vertebral body. 5.  No acute cardiopulmonary process. Aortic Atherosclerosis (ICD10-I70.0). Electronically Signed   By: Keane Police D.O.   On: 02/14/2022 14:51   DG Chest Port 1 View  Result Date: 02/14/2022 CLINICAL DATA:  Shortness of breath.  Frequent falls. EXAM: PORTABLE CHEST 1 VIEW COMPARISON:  06/16/2021 FINDINGS: Large hiatal hernia. Stable cardiomediastinal contours. New small left pleural effusion. No signs of interstitial edema or airspace consolidation. There is a age indeterminate fracture deformity involving the proximal left humerus. This appears new when compared with the previous exam. IMPRESSION: 1. New small left pleural effusion. 2. Age-indeterminate fracture deformity of the proximal left humerus. Electronically Signed   By: Kerby Moors M.D.   On: 02/14/2022 13:24  Procedures Procedures    Medications Ordered in ED Medications  morphine (PF) 2 MG/ML injection 2 mg (2 mg Intravenous Given 02/14/22 1330)  ondansetron (ZOFRAN) injection 4 mg (4 mg Intravenous Given 02/14/22 1328)    ED Course/ Medical Decision Making/ A&P Clinical Course as of 02/14/22 1535  Sat Feb 14, 2022  1508 CT Chest Wo Contrast [JL]    Clinical Course User Index [JL] Regan Lemming, MD                           Medical Decision Making Amount and/or Complexity of Data Reviewed Labs: ordered. Radiology: ordered. Decision-making details documented in ED Course.  Risk Prescription drug management.   88 yo F with a chief complaints of left-sided chest pain.  The patient has been having pain there since she had a fall about 11 days ago.  Had been seen at an outside urgent care and was diagnosed with pneumonia and was started on antibiotics.  Since then she has been having significant pain with movement and deep inspiration.  Denies fevers denies cough.  Pain worsened last night and  came to the ED for evaluation.  Likely rib fracture based on history.  Prolonged symptoms will obtain blood work chest x-ray reassess.  Patient is mildly anemic.  No significant electrolyte abnormality.  Chest x-ray independently interpreted by me with the moderate size left pleural effusion.  We will order a CT scan to better visualize.  CT with 3 rib fractures is independently interpreted by me.  Radiology read with pleural effusion.  I discussed the case with trauma surgery, Dr. Bobbye Morton recommended medical admission at [facility].  The patients results and plan were reviewed and discussed.   Any x-rays performed were independently reviewed by myself.   Differential diagnosis were considered with the presenting HPI.  Medications  morphine (PF) 2 MG/ML injection 2 mg (2 mg Intravenous Given 02/14/22 1330)  ondansetron (ZOFRAN) injection 4 mg (4 mg Intravenous Given 02/14/22 1328)    Vitals:   02/14/22 1430 02/14/22 1445 02/14/22 1500 02/14/22 1504  BP:    139/86  Pulse: 77 78 77 78  Resp: (!) '22  19 17  '$ Temp:      TempSrc:      SpO2: 93% 98% 93% 93%  Weight:      Height:        Final diagnoses:  Closed fracture of multiple ribs of left side, initial encounter             Final Clinical Impression(s) / ED Diagnoses Final diagnoses:  Closed fracture of multiple ribs of left side, initial encounter    Rx / DC Orders ED Discharge Orders     None         Deno Etienne, DO 02/14/22 1535

## 2022-02-14 NOTE — Assessment & Plan Note (Signed)
Pleural effusion in the setting of being anticoagulated with recent rib fractures. Order IR consult for possible thoracentesis to see if there is a hemothorax Hold Eliquis and aspirin Appreciate trauma consult

## 2022-02-14 NOTE — Assessment & Plan Note (Signed)
Order PT OT on consult

## 2022-02-14 NOTE — Consult Note (Signed)
Reason for Consult/Chief Complaint: fall 12d ago Consultant: Armandina Gemma, MD  Brittney Hicks is an 88 y.o. female.   HPI: 69F s/p mechanical GLF, reports her knees gave out from under her. This occurred 12d ago. She was seen at [facility] and had XR done, but no injuries identified. Continued to have pain of L chest which prompted her to seek medical attention again. Lives with her daughter. Reports taking some hydrocodone tablets at home which didn't relieve her pain. History is aided by patient's daughter present at bedside. Patient notes a recent stool that she describes as dark. Unable to obtain additional history regarding this from the patient and daughter at bedside was unaware of this and has no additional history to contribute.   Past Medical History:  Diagnosis Date   Chronic back pain greater than 3 months duration    Chronic pain    Fibromyalgia    Osteoarthritis    PE (pulmonary embolism)    Skin cancer    Stroke [facility])     Past Surgical History:  Procedure Laterality Date   ABDOMINAL HYSTERECTOMY     CATARACT EXTRACTION, BILATERAL     CHOLECYSTECTOMY     FOOT SURGERY     HAND SURGERY     INSERTION OF MESH     WRIST FRACTURE SURGERY      History reviewed. No pertinent family history.  Social History:  reports that she has never smoked. She has never used smokeless tobacco. She reports that she does not drink alcohol and does not use drugs.  Allergies:  Allergies  Allergen Reactions   Benzocaine Other (See Comments)    Oral blisters   Lovenox [Enoxaparin Sodium] Other (See Comments)    Has received this med without issue since it was listed as an allergy   Stadol [Butorphanol] Other (See Comments)    Had a stroke after receiving   Tape Other (See Comments)    blisters   Vistaril [Hydroxyzine Hcl] Other (See Comments)    Had a stroke after receiving   Warfarin And Related Other (See Comments)    Hematoma while on this and lovenox   Demerol [Meperidine] Rash    Dilaudid [Hydromorphone Hcl] Itching and Rash   Macrobid [Nitrofurantoin] Rash    Medications: I have reviewed the patient's current medications.  Results for orders placed or performed during the hospital encounter of 02/14/22 (from the past 48 hour(s))  CBC with Differential     Status: Abnormal   Collection Time: 02/14/22  1:27 PM  Result Value Ref Range   WBC 4.0 4.0 - 10.5 K/uL   RBC 2.63 (L) 3.87 - 5.11 MIL/uL   Hemoglobin 8.2 (L) 12.0 - 15.0 g/dL   HCT 25.7 (L) 36.0 - 46.0 %   MCV 97.7 80.0 - 100.0 fL   MCH 31.2 26.0 - 34.0 pg   MCHC 31.9 30.0 - 36.0 g/dL   RDW 13.5 11.5 - 15.5 %   Platelets 253 150 - 400 K/uL   nRBC 0.0 0.0 - 0.2 %   Neutrophils Relative % 50 %   Neutro Abs 2.0 1.7 - 7.7 K/uL   Lymphocytes Relative 33 %   Lymphs Abs 1.3 0.7 - 4.0 K/uL   Monocytes Relative 12 %   Monocytes Absolute 0.5 0.1 - 1.0 K/uL   Eosinophils Relative 4 %   Eosinophils Absolute 0.1 0.0 - 0.5 K/uL   Basophils Relative 1 %   Basophils Absolute 0.0 0.0 - 0.1 K/uL  Immature Granulocytes 0 %   Abs Immature Granulocytes 0.01 0.00 - 0.07 K/uL    Comment: Performed at [facility], [address] [place], [place] [address]  Comprehensive metabolic panel     Status: Abnormal   Collection Time: 02/14/22  1:27 PM  Result Value Ref Range   Sodium 139 135 - 145 mmol/L   Potassium 4.3 3.5 - 5.1 mmol/L   Chloride 111 98 - 111 mmol/L   CO2 21 (L) 22 - 32 mmol/L   Glucose, Bld 87 70 - 99 mg/dL    Comment: Glucose reference range applies only to samples taken after fasting for at least 8 hours.   BUN 40 (H) 8 - 23 mg/dL   Creatinine, Ser 2.16 (H) 0.44 - 1.00 mg/dL   Calcium 8.6 (L) 8.9 - 10.3 mg/dL   Total Protein 6.9 6.5 - 8.1 g/dL   Albumin 3.2 (L) 3.5 - 5.0 g/dL   AST 12 (L) 15 - 41 U/L   ALT 9 0 - 44 U/L   Alkaline Phosphatase 63 38 - 126 U/L   Total Bilirubin 0.3 0.3 - 1.2 mg/dL   GFR, Estimated 21 (L) >60 mL/min    Comment: (NOTE) Calculated using the CKD-EPI  Creatinine Equation (2021)    Anion gap 7 5 - 15    Comment: Performed at [facility], [address] [place], [place] [address]  Troponin I (High Sensitivity)     Status: None   Collection Time: 02/14/22  1:27 PM  Result Value Ref Range   Troponin I (High Sensitivity) 9 <18 ng/L    Comment: (NOTE) Elevated high sensitivity troponin I (hsTnI) values and significant  changes across serial measurements may suggest ACS but many other  chronic and acute conditions are known to elevate hsTnI results.  Refer to the "Links" section for chest pain algorithms and additional  guidance. Performed at [facility], [address] [place], [place] [address]   SARS Coronavirus 2 by RT PCR (hospital order, performed in [facility] hospital lab) *cepheid single result test* Anterior Nasal Swab     Status: None   Collection Time: 02/14/22  3:41 PM   Specimen: Anterior Nasal Swab  Result Value Ref Range   SARS Coronavirus 2 by RT PCR NEGATIVE NEGATIVE    Comment: (NOTE) SARS-CoV-2 target nucleic acids are NOT DETECTED.  The SARS-CoV-2 RNA is generally detectable in upper and lower respiratory specimens during the acute phase of infection. The lowest concentration of SARS-CoV-2 viral copies this assay can detect is 250 copies / mL. A negative result does not preclude SARS-CoV-2 infection and should not be used as the sole basis for treatment or other patient management decisions.  A negative result may occur with improper specimen collection / handling, submission of specimen other than nasopharyngeal swab, presence of viral mutation(s) within the areas targeted by this assay, and inadequate number of viral copies (<250 copies / mL). A negative result must be combined with clinical observations, patient history, and epidemiological information.  Fact Sheet for Patients:   https://www.patel.info/  Fact Sheet for Healthcare  Providers: https://hall.com/  This test is not yet approved or  cleared by the [place] FDA and has been authorized for detection and/or diagnosis of SARS-CoV-2 by FDA under an Emergency Use Authorization (EUA).  This EUA will remain in effect (meaning this test can be used) for the duration of the COVID-19 declaration under Section 564(b)(1) of the Act,  21 U.S.C. section 360bbb-3(b)(1), unless the authorization is terminated or revoked sooner.  Performed at [facility], [address], [place], [place] [address]     CT Chest Wo Contrast  Addendum Date: 02/14/2022   ADDENDUM REPORT: 02/14/2022 16:03 ADDENDUM: The left pleural effusion measures simple fluid density, and does not appears to be hemothorax. No pneumothorax. Electronically Signed   By: Keane Police D.O.   On: 02/14/2022 16:03   Result Date: 02/14/2022 CLINICAL DATA:  Blunt chest trauma.  Frequent falls. EXAM: CT CHEST WITHOUT CONTRAST TECHNIQUE: Multidetector CT imaging of the chest was performed following the standard protocol without IV contrast. RADIATION DOSE REDUCTION: This exam was performed according to the departmental dose-optimization program which includes automated exposure control, adjustment of the mA and/or kV according to patient size and/or use of iterative reconstruction technique. COMPARISON:  Chest radiograph performed earlier on the same date. FINDINGS: Cardiovascular: Normal heart size. No pericardial effusion. Mild coronary artery atherosclerotic calcifications. Aorta is normal in caliber with atherosclerotic calcification of the aortic arch. Mediastinum/Nodes: No enlarged mediastinal or axillary lymph nodes. Thyroid gland, trachea, and esophagus demonstrate no significant findings. Large hiatal hernia with intrathoracic stomach. Lungs/Pleura: Moderate size left pleural effusion. Left basilar atelectasis secondary to large hiatal hernia. Mild bilateral lower lobe  fibrosis/dependent atelectasis. No evidence of pneumonia or pulmonary edema. Upper Abdomen: Status post cholecystectomy. Partially imaged IVC filter in place. Large hiatal hernia with intrathoracic stomach. Musculoskeletal: Mildly displaced fractures of the left 5-8th ribs. Osteopenia and moderate thoracic spondylosis. Chronic compression deformity of L1 vertebral body. IMPRESSION: 1.  Mildly displaced fractures of the left 5-8th ribs. 2.  Moderate left pleural effusion with left basilar atelectasis. 3.  Large hiatal hernia with intrathoracic stomach. 4.  Chronic compression deformity of the L1 vertebral body. 5.  No acute cardiopulmonary process. Aortic Atherosclerosis (ICD10-I70.0). Electronically Signed: By: Keane Police D.O. On: 02/14/2022 14:51   DG Chest Port 1 View  Result Date: 02/14/2022 CLINICAL DATA:  Shortness of breath.  Frequent falls. EXAM: PORTABLE CHEST 1 VIEW COMPARISON:  06/16/2021 FINDINGS: Large hiatal hernia. Stable cardiomediastinal contours. New small left pleural effusion. No signs of interstitial edema or airspace consolidation. There is a age indeterminate fracture deformity involving the proximal left humerus. This appears new when compared with the previous exam. IMPRESSION: 1. New small left pleural effusion. 2. Age-indeterminate fracture deformity of the proximal left humerus. Electronically Signed   By: Kerby Moors M.D.   On: 02/14/2022 13:24    ROS 10 point review of systems is negative except as listed above in HPI.   Physical Exam Blood pressure (!) 144/64, pulse 81, temperature 98.2 F (36.8 C), temperature source Oral, resp. rate 16, height '5\' 2"'$  (1.575 m), weight 55.3 kg, SpO2 97 %. Constitutional: well-developed, well-nourished HEENT: pupils equal, round, reactive to light, 85m b/l, moist conjunctiva, external inspection of ears and nose normal, hearing intact Oropharynx: normal oropharyngeal mucosa, poor dentition Neck: no thyromegaly, trachea midline, no  midline cervical tenderness to palpation Chest: breath sounds equal bilaterally, normal respiratory effort, no midline or lateral chest wall tenderness to palpation/deformity Abdomen: soft, NT, no bruising, no hepatosplenomegaly GU: normal female genitalia  Back: no wounds, no thoracic/lumbar spine tenderness to palpation, no thoracic/lumbar spine stepoffs Rectal: deferred Extremities: 2+ radial and pedal pulses bilaterally, intact motor and sensation bilateral UE and LE, + peripheral edema MSK: unable to assess gait/station, no clubbing/cyanosis of fingers/toes, normal ROM of all four extremities Skin: warm, dry, no rashes Psych:  normal mood/affect      Assessment/Plan: Mechanical GLF  L rib fx 5-8 - pain control, pulm toilet, IS, PT/OT Report of melena - send stool sample for guaiac Peripheral edema - recommend lowering or even stopping MIVF FEN - okay for reg diet from traum standpoint  DVT - recommend SCDs and trauma dosing 0.5cc/kg/dose BID ('25mg'$  BID due to renal dysfunction) Dispo - med-surg   Jesusita Oka, MD General and [facility]

## 2022-02-14 NOTE — Assessment & Plan Note (Signed)
-  chronic avoid nephrotoxic medications such as NSAIDs, Vanco Zosyn combo,  avoid hypotension, continue to follow renal function  

## 2022-02-14 NOTE — Subjective & Objective (Signed)
Patient had a fall 11 days ago she is on Eliquis history of PE status post IVC filter came in with worsening pain she initially was seen by urgent care and was treated for presumed pneumonia with antibiotics but continued to have pain and at this point presented to emergency department at [facility] where she was found to have effusion and multiple rib fractures mildly displaced fractures of left 5 through 8.  At that point she was sent to [facility] for further evaluation and pain management

## 2022-02-14 NOTE — Assessment & Plan Note (Addendum)
Appreciate trauma surgery consult Given multiple risks fractures patient is a risk for developing pneumonia. Order incentive spirometry Pain control as needed PT OT assessment secondary to history of falls Hold anticoagulation

## 2022-02-14 NOTE — Assessment & Plan Note (Signed)
Continue Toprol if blood pressure allows

## 2022-02-14 NOTE — ED Notes (Addendum)
[facility] in to transport patient to [facility]

## 2022-02-14 NOTE — ED Notes (Signed)
ED Provider at bedside. 

## 2022-02-14 NOTE — Assessment & Plan Note (Signed)
Expect some degree of sundowning while hospitalized we will monitor and address as needed

## 2022-02-14 NOTE — Progress Notes (Signed)
Plan of Care Note for accepted transfer   Patient: Brittney Hicks MRN: [id number]   DOA: 02/14/2022  Facility requesting transfer: [facility] Requesting Provider: Dr. Tyrone Nine Reason for transfer: left sided chest pain after fall 11 days ago.  Facility course: 88 year old female with hx of CKD stage 3, HTN, GERD, hiatal hernia, hx of PE with IVC filter who presented to ED with worsening pain after fall. She fell 11 days ago. Was seen at [facility] and given abx for pneumonia. CXR didn't show rib fractures, read as pneumonia. Still having pain so came to ED today.   Vitals: stable, on room air Hgb: 8.2, BUN: 40, creatinine: 2.16 (2.0-2.3)  CXR: new left pleural effusion. Age indeterminate fracture deformity of left proximal humerus  CT chest without contrast: mildly displaced fractures of the left 5-8th ribs. Moderate left pleural effusion with left basilar atelectasis. Large hiatal hernia with intrathoracic stomach.  In ED: trauma consulted. Given morphine/zofran. Trauma will be following. Also requested to talk to radiology to see if effusion is blood or not. Trauma requested medicine admit for pain control.    Plan of care: The patient is accepted for admission to Telemetry unit, at [facility]..    Author: Orma Flaming, MD 02/14/2022  Check [email or web address] for on-call coverage.  Nursing staff, Please call [place] number on Amion as soon as patient's arrival, so appropriate admitting provider can evaluate the pt.

## 2022-02-14 NOTE — ED Provider Notes (Signed)
  Physical Exam  BP (!) 147/86 (BP Location: Right Wrist)   Pulse 77   Temp 98.2 F (36.8 C) (Oral)   Resp 19   Ht '5\' 2"'$  (1.575 m)   Wt 55.3 kg   SpO2 93%   BMI 22.31 kg/m     Procedures  Procedures  ED Course / MDM   Clinical Course as of 02/14/22 1509  Sat Feb 14, 2022  1508 CT Chest Wo Contrast [JL]    Clinical Course User Index [JL] Regan Lemming, MD   Medical Decision Making Amount and/or Complexity of Data Reviewed Labs: ordered. Radiology: ordered. Decision-making details documented in ED Course.  Risk Prescription drug management. Decision regarding hospitalization.   88 year old female presenting 11 days after a fall. Reportedly was treated for PNA outpatient after XR imaging, finished course of Augmentin. Still with chest pain. CT Chest today with three rib fractures and a moderate left sided pleural effusion. Plan for trauma consult and likely admit.   CT Chest: IMPRESSION:  1.  Mildly displaced fractures of the left 5-8th ribs.     2.  Moderate left pleural effusion with left basilar atelectasis.     3.  Large hiatal hernia with intrathoracic stomach.     4.  Chronic compression deformity of the L1 vertebral body.     5.  No acute cardiopulmonary process.     Aortic Atherosclerosis (ICD10-I70.0).    Trauma surgery will follow the patient inpatient. Spoke with Dr. Genene Churn of [facility] radiology, fluid in the pleural effusion not blood by Hounsfield units. Dr. Rogers Blocker of hospitalist medicine was consulted for admission for pain control and accepted the patient in admission.          Regan Lemming, MD 02/14/22 [phone]

## 2022-02-14 NOTE — H&P (Signed)
Brittney Hicks AOZ:[id number] DOB: 09/23/1928 DOA: 02/14/2022   PCP: [name], MD   Outpatient Specialists:     NEphrology:       GI Dr.  Earlean Shawl  Patient arrived to ER on 02/14/22 at 1224 Referred by Attending Orma Flaming, MD   Patient coming from:    home Lives  With family    Chief Complaint:   Chief Complaint  Patient presents with   Fall    HPI: Brittney Hicks is a 88 y.o. female with medical history significant of  CKD stage 3, HTN, GERD, hiatal hernia, hx of PE with IVC filter , dementia    Presented with   chest pain Patient had a fall 11 days ago she is on Eliquis history of PE status post IVC filter came in with worsening pain she initially was seen by urgent care and was treated for presumed pneumonia with antibiotics but continued to have pain and at this point presented to emergency department at [facility] where she was found to have effusion and multiple rib fractures mildly displaced fractures of left 5 through 8.  At that point she was sent to [facility] for further evaluation and pain management Have not had any fevers or chills no coughing Recently completed Augmentin No hx of GI bleed, no black stools  Initial COVID TEST  NEGATIVE   Lab Results  Component Value Date   SARSCOV2NAA NEGATIVE 02/14/2022   SARSCOV2NAA POSITIVE (A) 10/08/2019     Regarding pertinent Chronic problems:      HTN on toprol   Hx of TIA -  with/out residual deficits      Hx of DVT/PE on - anticoagulation with  Eliquis, last dose was this AM status post IVC filter   CKD stage IIIa- baseline Cr 1.3 Estimated Creatinine Clearance: 13.4 mL/min (A) (by C-G formula based on SCr of 2.16 mg/dL (H)).  Lab Results  Component Value Date   CREATININE 2.16 (H) 02/14/2022   CREATININE 1.13 (H) 10/13/2019   CREATININE 1.24 (H) 10/12/2019   Chronic anemia - baseline hg Hemoglobin & Hematocrit  Recent Labs    02/14/22 1327  HGB 8.2*     While in  ER: Clinical Course as of 02/14/22 2021  Sat Feb 14, 2022  1508 CT Chest Wo Contrast [JL]    Clinical Course User Index [JL] Regan Lemming, MD     Noted to have significant chest pain treated with morphine Ordered    CXR - New small left pleural effusion. 2. Age-indeterminate fracture deformity of the proximal left humerus.     CT chest -IMPRESSION: 1. Mildly displaced fractures of the left 5-8th ribs.  2. Moderate left pleural effusion with left basilar atelectasis.  3. Large hiatal hernia with intrathoracic stomach.  4. Chronic compression deformity of the L1 vertebral body.  5. No acute cardiopulmonary process.   Following Medications were ordered in ER: Medications  morphine (PF) 2 MG/ML injection 2 mg (2 mg Intravenous Given 02/14/22 1330)  ondansetron (ZOFRAN) injection 4 mg (4 mg Intravenous Given 02/14/22 1328)  morphine (PF) 2 MG/ML injection 2 mg (2 mg Intravenous Given 02/14/22 1646)    _______________________________________________________ ER Provider Called trauma surgery They Recommend admit to medicine  Will see on arrival   ED Triage Vitals  Enc Vitals Group     BP 02/14/22 1244 (!) 146/73     Pulse Rate 02/14/22 1244 85     Resp 02/14/22 1244 16  Temp 02/14/22 1244 98.2 F (36.8 C)     Temp Source 02/14/22 1244 Oral     SpO2 02/14/22 1244 96 %     Weight 02/14/22 1246 122 lb (55.3 kg)     Height 02/14/22 1246 '5\' 2"'$  (1.575 m)     Head Circumference --      Peak Flow --      Pain Score 02/14/22 1815 0     Pain Loc --      Pain Edu? --      Excl. in [place]? --   TMAX(24)@     _________________________________________ Significant initial  Findings: Abnormal Labs Reviewed  CBC WITH DIFFERENTIAL/PLATELET - Abnormal; Notable for the following components:      Result Value   RBC 2.63 (*)    Hemoglobin 8.2 (*)    HCT 25.7 (*)    All other components within normal limits  COMPREHENSIVE METABOLIC PANEL - Abnormal; Notable for the following  components:   CO2 21 (*)    BUN 40 (*)    Creatinine, Ser 2.16 (*)    Calcium 8.6 (*)    Albumin 3.2 (*)    AST 12 (*)    GFR, Estimated 21 (*)    All other components within normal limits     _________________________ Troponin 9  ECG: Ordered Personally reviewed by me showing: HR : 79 Rhythm: NSR, Sinus   QTC 464   The recent clinical data is shown below. Vitals:   02/14/22 1600 02/14/22 1630 02/14/22 1650 02/14/22 1740  BP: (!) 147/82 (!) 142/89  (!) 144/64  Pulse: 79  80 81  Resp: '18 17 18 16  '$ Temp: 97.8 F (36.6 C)   98.2 F (36.8 C)  TempSrc:    Oral  SpO2: 91% 94% 97% 97%  Weight:      Height:        WBC     Component Value Date/Time   WBC 4.0 02/14/2022 1327   LYMPHSABS 1.3 02/14/2022 1327   MONOABS 0.5 02/14/2022 1327   EOSABS 0.1 02/14/2022 1327   BASOSABS 0.0 02/14/2022 1327      UA  ordered    Results for orders placed or performed during the hospital encounter of 02/14/22  SARS Coronavirus 2 by RT PCR (hospital order, performed in [place] hospital lab) *cepheid single result test* Anterior Nasal Swab     Status: None   Collection Time: 02/14/22  3:41 PM   Specimen: Anterior Nasal Swab  Result Value Ref Range Status   SARS Coronavirus 2 by RT PCR NEGATIVE NEGATIVE Final           _______________________________________________ Hospitalist was called for admission for   Closed fracture of multiple ribs of left side, initial encounter      The following Work up has been ordered so far:  Orders Placed This Encounter  Procedures   SARS Coronavirus 2 by RT PCR (hospital order, performed in [place] hospital lab) *cepheid single result test* Anterior Nasal Swab   DG Chest Port 1 View   CT Chest Wo Contrast   CBC with Differential   Comprehensive metabolic panel   Cardiac monitoring   Do not attempt resuscitation (DNR)   Consult to trauma surgery   Consult to hospitalist   ED EKG   EKG 12-Lead   EKG 12-Lead   Place in observation  (patient's expected length of stay will be less than 2 midnights)     OTHER Significant initial  Findings:  labs showing:    Recent Labs  Lab 02/14/22 1327  NA 139  K 4.3  CO2 21*  GLUCOSE 87  BUN 40*  CREATININE 2.16*  CALCIUM 8.6*    Cr   stable, last cr was 2.3 in march 2023 Lab Results  Component Value Date   CREATININE 2.16 (H) 02/14/2022   CREATININE 1.13 (H) 10/13/2019   CREATININE 1.24 (H) 10/12/2019    Recent Labs  Lab 02/14/22 1327  AST 12*  ALT 9  ALKPHOS 63  BILITOT 0.3  PROT 6.9  ALBUMIN 3.2*   Lab Results  Component Value Date   CALCIUM 8.6 (L) 02/14/2022          Plt: Lab Results  Component Value Date   PLT 253 02/14/2022       COVID-19 Labs  No results for input(s): DDIMER, FERRITIN, LDH, CRP in the last 72 hours.  Lab Results  Component Value Date   SARSCOV2NAA NEGATIVE 02/14/2022   SARSCOV2NAA POSITIVE (A) 10/08/2019         Recent Labs  Lab 02/14/22 1327  WBC 4.0  NEUTROABS 2.0  HGB 8.2*  HCT 25.7*  MCV 97.7  PLT 253    HG/HCT  stable,       Component Value Date/Time   HGB 8.2 (L) 02/14/2022 1327   HCT 25.7 (L) 02/14/2022 1327   HCT 31.1 (L) 10/08/2019 0440   MCV 97.7 02/14/2022 1327     Cardiac Panel (last 3 results) Recent Labs    02/14/22 2041  CKTOTAL 41            Cultures:    Component Value Date/Time   SDES  10/08/2019 0107    BLOOD RIGHT WRIST Performed at [facility], [place] [address], [place], [place] [address]    SDES  10/08/2019 0107    BLOOD LEFT ANTECUBITAL Performed at [facility] [address] [place], [place] [address]    [place]  10/08/2019 0107    BOTTLES DRAWN AEROBIC AND ANAEROBIC Blood Culture adequate volume Performed at [facility] [address] [place], [place] [address]    [place]  10/08/2019 0107    BOTTLES DRAWN AEROBIC AND ANAEROBIC Blood Culture results may not be optimal due to an excessive volume of  blood received in culture bottles Performed at [facility], [place] [address] [place], [place] [address]    CULT  10/08/2019 0107    NO GROWTH 5 DAYS Performed at [facility], [place] [address] [place], [place] [address]    CULT  10/08/2019 0107    NO GROWTH 5 DAYS Performed at [facility], [place] [address], [place], [place] [address]    REPTSTATUS 10/13/2019 FINAL 10/08/2019 0107   REPTSTATUS 10/13/2019 FINAL 10/08/2019 0107     Radiological Exams on Admission: CT Chest Wo Contrast  Addendum Date: 02/14/2022   ADDENDUM REPORT: 02/14/2022 16:03 ADDENDUM: The left pleural effusion measures simple fluid density, and does not appears to be hemothorax. No pneumothorax. Electronically Signed   By: Keane Police D.O.   On: 02/14/2022 16:03   Result Date: 02/14/2022 CLINICAL DATA:  Blunt chest trauma.  Frequent falls. EXAM: CT CHEST WITHOUT CONTRAST TECHNIQUE: Multidetector CT imaging of the chest was performed following the standard protocol without IV contrast. RADIATION DOSE REDUCTION: This exam was performed according to the departmental dose-optimization program which includes automated exposure control, adjustment of the mA and/or kV according to patient size and/or  use of iterative reconstruction technique. COMPARISON:  Chest radiograph performed earlier on the same date. FINDINGS: Cardiovascular: Normal heart size. No pericardial effusion. Mild coronary artery atherosclerotic calcifications. Aorta is normal in caliber with atherosclerotic calcification of the aortic arch. Mediastinum/Nodes: No enlarged mediastinal or axillary lymph nodes. Thyroid gland, trachea, and esophagus demonstrate no significant findings. Large hiatal hernia with intrathoracic stomach. Lungs/Pleura: Moderate size left pleural effusion. Left basilar atelectasis secondary to large hiatal hernia. Mild bilateral lower lobe fibrosis/dependent atelectasis. No evidence of pneumonia or pulmonary edema.  Upper Abdomen: Status post cholecystectomy. Partially imaged IVC filter in place. Large hiatal hernia with intrathoracic stomach. Musculoskeletal: Mildly displaced fractures of the left 5-8th ribs. Osteopenia and moderate thoracic spondylosis. Chronic compression deformity of L1 vertebral body. IMPRESSION: 1.  Mildly displaced fractures of the left 5-8th ribs. 2.  Moderate left pleural effusion with left basilar atelectasis. 3.  Large hiatal hernia with intrathoracic stomach. 4.  Chronic compression deformity of the L1 vertebral body. 5.  No acute cardiopulmonary process. Aortic Atherosclerosis (ICD10-I70.0). Electronically Signed: By: Keane Police D.O. On: 02/14/2022 14:51   DG Chest Port 1 View  Result Date: 02/14/2022 CLINICAL DATA:  Shortness of breath.  Frequent falls. EXAM: PORTABLE CHEST 1 VIEW COMPARISON:  06/16/2021 FINDINGS: Large hiatal hernia. Stable cardiomediastinal contours. New small left pleural effusion. No signs of interstitial edema or airspace consolidation. There is a age indeterminate fracture deformity involving the proximal left humerus. This appears new when compared with the previous exam. IMPRESSION: 1. New small left pleural effusion. 2. Age-indeterminate fracture deformity of the proximal left humerus. Electronically Signed   By: Kerby Moors M.D.   On: 02/14/2022 13:24   _______________________________________________________________________________________________________ Latest  Blood pressure (!) 144/64, pulse 81, temperature 98.2 F (36.8 C), temperature source Oral, resp. rate 16, height '5\' 2"'$  (1.575 m), weight 55.3 kg, SpO2 97 %.   Vitals  labs and radiology finding personally reviewed  Review of Systems:    Pertinent positives include: falls, chest pain   Constitutional:  No weight loss, night sweats, Fevers, chills, fatigue, weight loss  HEENT:  No headaches, Difficulty swallowing,Tooth/dental problems,Sore throat,  No sneezing, itching, ear ache, nasal  congestion, post nasal drip,  Cardio-vascular:  No   Orthopnea, PND, anasarca, dizziness, palpitations.no Bilateral lower extremity swelling  GI:  No heartburn, indigestion, abdominal pain, nausea, vomiting, diarrhea, change in bowel habits, loss of appetite, melena, blood in stool, hematemesis Resp:  no shortness of breath at rest. No dyspnea on exertion, No excess mucus, no productive cough, No non-productive cough, No coughing up of blood.No change in color of mucus.No wheezing. Skin:  no rash or lesions. No jaundice GU:  no dysuria, change in color of urine, no urgency or frequency. No straining to urinate.  No flank pain.  Musculoskeletal:  No joint pain or no joint swelling. No decreased range of motion. No back pain.  Psych:  No change in mood or affect. No depression or anxiety. No memory loss.  Neuro: no localizing neurological complaints, no tingling, no weakness, no double vision, no gait abnormality, no slurred speech, no confusion  All systems reviewed and apart from [place] all are negative _______________________________________________________________________________________________ Past Medical History:   Past Medical History:  Diagnosis Date   Chronic back pain greater than 3 months duration    Chronic pain    Fibromyalgia    Osteoarthritis    PE (pulmonary embolism)    Skin cancer    Stroke [facility])       Past  Surgical History:  Procedure Laterality Date   ABDOMINAL HYSTERECTOMY     CATARACT EXTRACTION, BILATERAL     CHOLECYSTECTOMY     FOOT SURGERY     HAND SURGERY     INSERTION OF MESH     WRIST FRACTURE SURGERY      Social History:  Ambulatory   walker     reports that she has never smoked. She has never used smokeless tobacco. She reports that she does not drink alcohol and does not use drugs.     Family History:   History reviewed. No pertinent family  history. ______________________________________________________________________________________________ Allergies: Allergies  Allergen Reactions   Benzocaine Other (See Comments)    Oral blisters   Lovenox [Enoxaparin Sodium] Other (See Comments)    Has received this med without issue since it was listed as an allergy   Stadol [Butorphanol] Other (See Comments)    Had a stroke after receiving   Tape Other (See Comments)    blisters   Vistaril [Hydroxyzine Hcl] Other (See Comments)    Had a stroke after receiving   Warfarin And Related Other (See Comments)    Hematoma while on this and lovenox   Demerol [Meperidine] Rash   Dilaudid [Hydromorphone Hcl] Itching and Rash   Macrobid [Nitrofurantoin] Rash     Prior to Admission medications   Medication Sig Start Date End Date Taking? Authorizing Provider  cetirizine (ZYRTEC) 10 MG tablet Take 10 mg by mouth daily.   Yes [provider]  clonazePAM (KLONOPIN) 1 MG tablet Take 1.5 mg by mouth daily.    Yes [provider]  Cyanocobalamin (VITAMIN B-12 IJ) Inject 1,000 mcg as directed every 30 (thirty) days.   Yes [provider]  ELIQUIS 2.5 MG TABS tablet Take 2.5 mg by mouth 2 (two) times daily. 01/26/22  Yes [provider]  guaiFENesin-dextromethorphan (ROBITUSSIN DM) 100-10 MG/5ML syrup Take 10 mLs by mouth every 4 (four) hours as needed for cough. 10/14/19  Yes Debbe Odea, MD  HYDROcodone-acetaminophen (NORCO/VICODIN) 5-325 MG tablet Take 1 tablet by mouth daily as needed for moderate pain. 11/26/21  Yes [provider]  metoprolol succinate (TOPROL-XL) 25 MG 24 hr tablet Take 12.5 mg by mouth daily. 08/01/19  Yes [provider]  mirtazapine (REMERON) 45 MG tablet Take 45 mg by mouth at bedtime. 08/01/19  Yes [provider]  pantoprazole (PROTONIX) 40 MG tablet Take 40 mg by mouth 2 (two) times daily. 08/01/19  Yes [provider]  QUEtiapine (SEROQUEL) 25 MG  tablet Take 25 mg by mouth at bedtime. 01/26/22  Yes [provider]  rOPINIRole (REQUIP) 2 MG tablet Take 7 mg by mouth at bedtime.    Yes [provider]  sertraline (ZOLOFT) 100 MG tablet Take 100 mg by mouth daily.   Yes [provider]  trimethoprim (TRIMPEX) 100 MG tablet Take 100 mg by mouth daily. 01/28/22  Yes [provider]  valACYclovir (VALTREX) 500 MG tablet Take 500 mg by mouth daily. 01/26/22  Yes [provider]    ___________________________________________________________________________________________________ Physical Exam:    02/14/2022    5:40 PM 02/14/2022    4:50 PM 02/14/2022    4:30 PM  Vitals with BMI  Systolic 342  876  Diastolic 64  89  Pulse 81 80      1. General:  in  Acute distress complaining of severe pain with movement  Chronically ill   -appearing 2. Psychological: Alert and   Oriented 3. Head/ENT:  Dry Mucous Membranes                          Head Non traumatic, neck supple                         Poor Dentition 4. SKIN:  decreased Skin turgor,  Skin clean Dry and intact no rash 5. Heart: Regular rate and rhythm no  Murmur, no Rub or gallop 6. Lungs: , no wheezes some crackles diminished on left base 7. Abdomen: Soft,  non-tender, Non distended   obese  bowel sounds present 8. Lower extremities: no clubbing, cyanosis, no  edema 9. Neurologically Grossly intact, moving all 4 extremities equally  10. MSK: Normal range of motion    Chart has been reviewed  ______________________________________________________________________________________________  Assessment/Plan  88 y.o. female with medical history significant of  CKD stage 3, HTN, GERD, hiatal hernia, hx of PE with IVC filter , dementia Admitted for  Closed fracture of multiple ribs of left side, pleural effusion in the left Present on Admission:  Rib fractures  Pleural effusion  CKD (chronic kidney disease), stage III ([place])  Essential  hypertension  Rib fractures Appreciate trauma surgery consult Given multiple risks fractures patient is a risk for developing pneumonia. Order incentive spirometry Pain control as needed PT OT assessment secondary to history of falls Hold anticoagulation  Pleural effusion Pleural effusion in the setting of being anticoagulated with recent rib fractures. Order IR consult for possible thoracentesis to see if there is a hemothorax Hold Eliquis and aspirin Appreciate trauma consult  CKD (chronic kidney disease), stage III ([place])  -chronic avoid nephrotoxic medications such as NSAIDs, Vanco Zosyn combo,  avoid hypotension, continue to follow renal function   Essential hypertension Continue Toprol if blood pressure allows   History of DVT (deep vein thrombosis) History of DVT will hold off on Eliquis for right now patient is status post IVC filter  Fall at home Order PT OT on consult  Dementia without behavioral disturbance ([place]) Expect some degree of sundowning while hospitalized we will monitor and address as needed    Other plan as per orders.  DVT prophylaxis:  SCD     Code Status:  DNR/DNI   as per patient  family  I had personally discussed CODE STATUS with patient and family    Family Communication:   Family not at  Bedside  plan of care was discussed on the phone with Daughter,  Disposition Plan:    To home once workup is complete and patient is stable  Following barriers for discharge:                            Electrolytes corrected                               Anemia corrected                             Pain controlled with PO medications  Will need consultants to evaluate patient prior to discharge                       Would benefit from PT/OT eval prior to DC  Ordered                   Swallow eval - SLP ordered                                     Transition of care consulted                    Nutrition    consulted                                    Consults called: trauma surgery  Admission status:  ED Disposition     ED Disposition  [place]: [place] [[id number]]  Level of Care: Telemetry Medical [104]  Interfacility transfer: Yes  May place patient in observation at [facility] or [place] if equivalent level of care is available:: No  Covid Evaluation: Asymptomatic - no recent exposure (last 10 days) testing not required  Diagnosis: Rib fractures [315400]  Admitting Physician: Orma Flaming [[id number]]  Attending Physician: Orma Flaming [[id number]]           Obs      Level of care     tele  For 12H      Lab Results  Component Value Date   [place] 02/14/2022     Precautions: admitted as   Covid Negative      Brittney Hicks 02/14/2022, 11:20 PM    [facility]     after 2 AM please page floor coverage PA If 7AM-7PM, please contact the day team taking care of the patient using [email or web address]   Patient was evaluated in the context of the global COVID-19 pandemic, which necessitated consideration that the patient might be at risk for infection with the SARS-CoV-2 virus that causes COVID-19. Institutional protocols and algorithms that pertain to the evaluation of patients at risk for COVID-19 are in a state of rapid change based on information released by regulatory bodies including the CDC and federal and state organizations. These policies and algorithms were followed during the patient's care.

## 2022-02-14 NOTE — ED Triage Notes (Signed)
Pt arrives pov with family, endorses freq falls, recent mechanical fall on 5/16. Tx at [facility] in [place], dx with pneumonia. Pt c/o bilateral pain under breasts, pain with deep inspiration. Endorses thinners

## 2022-02-15 DIAGNOSIS — I10 Essential (primary) hypertension: Secondary | ICD-10-CM | POA: Diagnosis not present

## 2022-02-15 DIAGNOSIS — S2242XA Multiple fractures of ribs, left side, initial encounter for closed fracture: Secondary | ICD-10-CM | POA: Diagnosis not present

## 2022-02-15 DIAGNOSIS — F0390 Unspecified dementia without behavioral disturbance: Secondary | ICD-10-CM | POA: Diagnosis not present

## 2022-02-15 DIAGNOSIS — N1832 Chronic kidney disease, stage 3b: Secondary | ICD-10-CM | POA: Diagnosis not present

## 2022-02-15 LAB — CBC
HCT: 25.5 % — ABNORMAL LOW (ref 36.0–46.0)
HCT: 26 % — ABNORMAL LOW (ref 36.0–46.0)
Hemoglobin: 8 g/dL — ABNORMAL LOW (ref 12.0–15.0)
Hemoglobin: 7.9 g/dL — ABNORMAL LOW (ref 12.0–15.0)
MCH: 31.1 pg (ref 26.0–34.0)
MCH: 30.5 pg (ref 26.0–34.0)
MCHC: 31.4 g/dL (ref 30.0–36.0)
MCHC: 30.4 g/dL (ref 30.0–36.0)
MCV: 99.2 fL (ref 80.0–100.0)
MCV: 100.4 fL — ABNORMAL HIGH (ref 80.0–100.0)
Platelets: 257 10*3/uL (ref 150–400)
Platelets: 241 10*3/uL (ref 150–400)
RBC: 2.57 MIL/uL — ABNORMAL LOW (ref 3.87–5.11)
RBC: 2.59 MIL/uL — ABNORMAL LOW (ref 3.87–5.11)
RDW: 13.3 % (ref 11.5–15.5)
RDW: 13.3 % (ref 11.5–15.5)
WBC: 3.8 10*3/uL — ABNORMAL LOW (ref 4.0–10.5)
WBC: 4.4 10*3/uL (ref 4.0–10.5)
nRBC: 0 % (ref 0.0–0.2)
nRBC: 0 % (ref 0.0–0.2)

## 2022-02-15 LAB — COMPREHENSIVE METABOLIC PANEL
ALT: 13 U/L (ref 0–44)
AST: 21 U/L (ref 15–41)
Albumin: 3 g/dL — ABNORMAL LOW (ref 3.5–5.0)
Alkaline Phosphatase: 60 U/L (ref 38–126)
Anion gap: 8 (ref 5–15)
BUN: 37 mg/dL — ABNORMAL HIGH (ref 8–23)
CO2: 20 mmol/L — ABNORMAL LOW (ref 22–32)
Calcium: 8.7 mg/dL — ABNORMAL LOW (ref 8.9–10.3)
Chloride: 113 mmol/L — ABNORMAL HIGH (ref 98–111)
Creatinine, Ser: 2.13 mg/dL — ABNORMAL HIGH (ref 0.44–1.00)
GFR, Estimated: 21 mL/min — ABNORMAL LOW (ref >60)
Glucose, Bld: 100 mg/dL — ABNORMAL HIGH (ref 70–99)
Potassium: 4.6 mmol/L (ref 3.5–5.1)
Sodium: 141 mmol/L (ref 135–145)
Total Bilirubin: 0.6 mg/dL (ref 0.3–1.2)
Total Protein: 6.1 g/dL — ABNORMAL LOW (ref 6.5–8.1)

## 2022-02-15 LAB — PREALBUMIN: Prealbumin: 18.7 mg/dL (ref 18–38)

## 2022-02-15 MED ORDER — METHOCARBAMOL 500 MG PO TABS: 500.0000 mg | ORAL_TABLET | Freq: Four times a day (QID) | ORAL | 0 refills | Status: AC | PRN

## 2022-02-15 MED ORDER — METHOCARBAMOL 500 MG PO TABS
1000.0000 mg | ORAL_TABLET | Freq: Three times a day (TID) | ORAL | Status: DC
  Administered 2022-02-15 (×3): 1000 mg via ORAL
  Filled 2022-02-15 (×3): qty 2

## 2022-02-15 MED ORDER — METHOCARBAMOL 500 MG PO TABS
500.0000 mg | ORAL_TABLET | Freq: Four times a day (QID) | ORAL | 0 refills | Status: DC | PRN
Start: 2022-02-15 — End: 2022-02-15

## 2022-02-15 MED ORDER — ACETAMINOPHEN 500 MG PO TABS
1000.0000 mg | ORAL_TABLET | Freq: Four times a day (QID) | ORAL | Status: DC
Start: 2022-02-15 — End: 2022-02-15
  Administered 2022-02-15 (×3): 1000 mg via ORAL
  Filled 2022-02-15 (×3): qty 2

## 2022-02-15 MED ORDER — LIDOCAINE 5 % EX PTCH
1.0000 | MEDICATED_PATCH | Freq: Every day | CUTANEOUS | Status: DC
  Administered 2022-02-15: 1 via TRANSDERMAL
  Filled 2022-02-15: qty 1

## 2022-02-15 MED ORDER — IBUPROFEN 400 MG PO TABS
400.0000 mg | ORAL_TABLET | Freq: Four times a day (QID) | ORAL | Status: DC
  Administered 2022-02-15: 400 mg via ORAL
  Filled 2022-02-15 (×2): qty 1

## 2022-02-15 MED ORDER — OXYCODONE HCL 5 MG PO TABS: 2.5000 mg | ORAL_TABLET | ORAL | 0 refills | Status: AC | PRN

## 2022-02-15 MED ORDER — OXYCODONE HCL 5 MG PO TABS: 2.5000 mg | ORAL_TABLET | ORAL | 0 refills | Status: DC | PRN

## 2022-02-15 MED ORDER — ENOXAPARIN SODIUM 300 MG/3ML IJ SOLN
25.0000 mg | Freq: Two times a day (BID) | INTRAMUSCULAR | Status: DC
  Filled 2022-02-15 (×2): qty 0.25

## 2022-02-15 MED ORDER — LIDOCAINE 5 % EX PTCH: 1.0000 | MEDICATED_PATCH | CUTANEOUS | 1 refills | Status: AC

## 2022-02-15 MED ORDER — LIDOCAINE 5 % EX PTCH: 1.0000 | MEDICATED_PATCH | CUTANEOUS | 1 refills | Status: DC

## 2022-02-15 MED ORDER — IBUPROFEN 400 MG PO TABS: 400.0000 mg | ORAL_TABLET | Freq: Four times a day (QID) | ORAL | 0 refills | Status: AC

## 2022-02-15 MED ORDER — MORPHINE SULFATE (PF) 2 MG/ML IV SOLN: 1.0000 mg | INTRAVENOUS | Status: DC | PRN

## 2022-02-15 MED ORDER — OXYCODONE HCL 5 MG PO TABS: 2.5000 mg | ORAL_TABLET | ORAL | Status: DC | PRN

## 2022-02-15 MED ORDER — IBUPROFEN 400 MG PO TABS
400.0000 mg | ORAL_TABLET | Freq: Four times a day (QID) | ORAL | 0 refills | Status: DC
Start: 2022-02-15 — End: 2022-02-15

## 2022-02-15 NOTE — Evaluation (Signed)
Occupational Therapy Evaluation Patient Details Name: Brittney Hicks MRN: [id number] DOB: 1929/09/05 Today's Date: 02/15/2022   History of Present Illness Pt is a 88 y.o. female who presented 02/14/22 with chest pain after having fallen x11 days earlier. Pt found to have mildly displaced fxs of L ribs 5-8. Radiographs also revealed age-indeterminate fracture deformity of the proximal left humerus and chronic compression deformity of the L1 vertebral body. PMH: chronic back pain, fibromyalgia, OA, PE, skin cancer, CVA, CKD, dementia   Clinical Impression   Patient admitted for above and presents with problem list below, including weakness, decreased activity tolerance, impaired balance. She lives with family,has 24/7 support, and has assist from family and aide (2x/week) for ADLs as needed, IADLs and uses rollator for mobility with supervision. She demonstrates ability to complete ADLs setup to max assist, transfers and mobility in room using RW with min guard. Cognition appears at baseline with hx of dementia.  Based on performance today, believe she will benefit from acute OT services to optimize safety with ADLs and mobility at home. Anticipate no further needs after dc home, pt near baseline level for ADLs.  Will follow.        Recommendations for follow up therapy are one component of a multi-disciplinary discharge planning process, led by the attending physician.  Recommendations may be updated based on patient status, additional functional criteria and insurance authorization.   Follow Up Recommendations  No OT follow up    Assistance Recommended at Discharge Frequent or constant Supervision/Assistance  Patient can return home with the following A little help with walking and/or transfers;A lot of help with bathing/dressing/bathroom;Assistance with cooking/housework;Direct supervision/assist for medications management;Assist for transportation;Direct supervision/assist for financial  management;Help with stairs or ramp for entrance    Functional Status Assessment     Equipment Recommendations  None recommended by OT    Recommendations for Other Services       Precautions / Restrictions Precautions Precautions: Fall Restrictions Weight Bearing Restrictions: No      Mobility Bed Mobility               General bed mobility comments: OOB in recliner upon entry    Transfers                          Balance Overall balance assessment: Needs assistance Sitting-balance support: No upper extremity supported, Feet supported Sitting balance-Leahy Scale: Fair     Standing balance support: Bilateral upper extremity supported, No upper extremity supported, During functional activity Standing balance-Leahy Scale: Poor Standing balance comment: relies on RW, able to engage in ADls with min guard and 0 hand support                           ADL either performed or assessed with clinical judgement   ADL Overall ADL's : At baseline                                       General ADL Comments: assist from family/aides for ADLs as needed, demonstrates ability to complete toilet transfers with min guard, toileting with min assist and grooming sitting with setup     Vision         Perception     Praxis      Pertinent Vitals/Pain Pain Assessment Pain Assessment: Faces Faces Pain Scale:  Hurts little more Pain Location: L ribs Pain Descriptors / Indicators: Discomfort, Guarding Pain Intervention(s): Limited activity within patient's tolerance, Monitored during session, Repositioned     Hand Dominance     Extremity/Trunk Assessment Upper Extremity Assessment Upper Extremity Assessment: Generalized weakness   Lower Extremity Assessment Lower Extremity Assessment: Defer to PT evaluation   Cervical / Trunk Assessment Cervical / Trunk Assessment: Kyphotic   Communication Communication Communication: No  difficulties   Cognition Arousal/Alertness: Awake/alert Behavior During Therapy: Flat affect Overall Cognitive Status: History of cognitive impairments - at baseline                                 General Comments: dementia at baseline, min cueing for safety but anticipate near baseline.     General Comments  VSS on RA    Exercises     Shoulder Instructions      Home Living Family/patient expects to be discharged to:: Private residence Living Arrangements: Children Available Help at Discharge: Family;Available 24 hours/day;Personal care attendant (aide) Type of Home: House Home Access: Level entry     Home Layout: One level     Bathroom Shower/Tub: Tub/shower unit;Sponge bathes at baseline (shower 2x/week for showers, otherwise basin bathing at sink sitting on rollator)   Bathroom Toilet: Standard Bathroom Accessibility: Yes How Accessible:  (rollator) Home Equipment: Rollator (4 wheels);Other (comment);Tub bench;BSC/3in1;Grab bars - tub/shower (lift chair)   Additional Comments: sleeps in lift chair; has chair alarm in her seat for at night and pt also has a call bell      Prior Functioning/Environment Prior Level of Function : Needs assist;History of Falls (last six months)       Physical Assist : ADLs (physical);Mobility (physical) Mobility (physical): Gait ADLs (physical): Bathing;Dressing;IADLs Mobility Comments: Pt needs supervision for gait with rollator for safety. x3 falls in past 6 months. ADLs Comments: family assists with ADLs as needed, showers with aide 2x/week or does basin bathing sitting on rollator at sink in bathroom.  Family assist with IADLs.  grooming without assist.        OT Problem List: Decreased strength;Impaired balance (sitting and/or standing);Decreased activity tolerance;Decreased safety awareness;Decreased knowledge of use of DME or AE;Decreased cognition;Decreased knowledge of precautions      OT  Treatment/Interventions: Self-care/ADL training;Therapeutic exercise;DME and/or AE instruction;Therapeutic activities;Patient/family education;Balance training    OT Goals(Current goals can be found in the care plan section) Acute Rehab OT Goals Patient Stated Goal: home OT Goal Formulation: With patient Time For Goal Achievement: 03/01/22 Potential to Achieve Goals: Good  OT Frequency: Min 2X/week    Co-evaluation              AM-PAC OT "6 Clicks" Daily Activity     Outcome Measure Help from another person eating meals?: A Little Help from another person taking care of personal grooming?: A Little Help from another person toileting, which includes using toliet, bedpan, or urinal?: A Little Help from another person bathing (including washing, rinsing, drying)?: A Lot Help from another person to put on and taking off regular upper body clothing?: A Little Help from another person to put on and taking off regular lower body clothing?: A Lot 6 Click Score: 16   End of Session Equipment Utilized During Treatment: Rolling walker (2 wheels) Nurse Communication: Mobility status  Activity Tolerance: Patient tolerated treatment well Patient left: in chair;with call bell/phone within reach;with family/visitor present  OT Visit Diagnosis: Other abnormalities  of gait and mobility (R26.89);Muscle weakness (generalized) (M62.81);History of falling (Z91.81)                Time: 1278-7183 OT Time Calculation (min): 17 min Charges:  OT General Charges $OT Visit: 1 Visit OT Evaluation $OT Eval Moderate Complexity: 1 Mod  Jolaine Artist, OT Acute Rehabilitation Services Pager [phone] Office [phone]   Delight Stare 02/15/2022, 1:31 PM

## 2022-02-15 NOTE — Discharge Summary (Signed)
Physician Discharge Summary  Brittney Hicks NTI:[id number] DOB: 04/14/1929 DOA: 02/14/2022  PCP: [name], MD  Admit date: 02/14/2022 Discharge date: 02/15/2022  Admitted From: Home Disposition: Home  Recommendations for Outpatient Follow-up:  Follow up with PCP in 1-2 weeks Discharged on oxycodone as needed, Robaxin as needed, lidocaine patch for multiple left-sided rib fractures  Home Health: PT/RN/aide Equipment/Devices: None  Discharge Condition: Stable CODE STATUS: DNR Diet recommendation: Regular diet  History of present illness:  Brittney Hicks is a 88 year old female with past medical history significant for CKD stage IIIb versus 4, HTN, GERD, history of PE with IVC filter on Eliquis, hiatal hernia, advanced dementia who presented to [facility] the emergency department on 5/27 with continued left-sided chest pain.  Patient reports initial fall about 11 days ago and was seen in urgent care in which she was diagnosed with pneumonia started on antibiotics with Augmentin.  She reports she has completed the antibiotics but still having pain to her left side.  In the ED, her 98.2 F, HR 85, RR 16, BP 146/73, SPO2 96% on room air.  WBC 4.0, hemoglobin 8.2, platelets 253.  Sodium 139, potassium 4.3, chloride 111, CO2 21, glucose 87, BUN 40, creatinine 2.16.  High sensitive troponin 9.  Urinalysis unrevealing.  Chest x-ray with new small left pleural effusion, age-indeterminate fracture deformity of the proximal left humerus.  CT chest without contrast with mildly displaced fractures of the left fifth-eighth ribs, moderate left pleural effusion with left basilar atelectasis, large hiatal hernia with intrathoracic stomach, chronic compression fracture/deformity L1 vertebral body, no acute cardiopulmonary disease process.  Patient was given morphine and Zofran by EDP.  Trauma surgery was consulted.  Hospital service consulted for admission for pain control and therapy evaluation.  Patient  was transferred to [facility],The course:  Mechanical ground-level fall Left fifth through eighth rib fracture Patient presenting to ED with persistent left-sided chest pain.  Recently fell 11 days prior without loss of consciousness and evaluated in urgent care.  Was treated with a course of antibiotics for presumed pneumonia in which she completed course with Augmentin.  Patient is afebrile without leukocytosis.  CT chest without contrast shows mildly displaced fractures of the left fifth-eighth ribs.  Patient was seen by trauma surgery with conservative recommendations with pain control, pulmonary toilet and therapy evaluation.  Patient was started on Robaxin, oxycodone and lidocaine patch with good effect.  Patient was seen by PT and OT with recommendations of home health.  Left pleural effusion Noted on imaging, ED physician discussed with radiology, fluid in the pleural effusion not blood by Hounsfield units.  Patient is not hypoxic or tachypneic.  Discussed with daughter, reasonable to not proceed with thoracentesis given her advanced age, comorbidities and rib fractures as likely risks outweigh benefits.  She is in agreement as well as trauma surgery.  Left humerus fracture, chronic Imaging notable for age-indeterminate fracture deformity of the proximal left humerus.  Daughter reports occurred November 2022, nonoperative.  Daughter reports that she has been using the left arm without issue.  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  Essential hypertension Metoprolol succinate 12.5 mg p.o. daily  History of PE/DVT s/p IVC filter on anticoagulation Eliquis 2.5 mg p.o. twice daily  GERD: Protonix 40 mg p.o. twice daily  Restless leg syndrome: Requip 7 mg p.o. nightly  Anxiety/depression: Sertraline 100 mg p.o. daily  Advanced dementia Seroquel 25 mg p.o. nightly, Remeron 45 milligrams p.o. nightly. Supportive care  Weakness,  debility, gait disturbance,  deconditioning Evaluated by PT and OT during hospitalization with recommendation of home health.   Discharge Diagnoses:  Principal Problem:   Rib fractures Active Problems:   Pleural effusion   Essential hypertension   History of DVT (deep vein thrombosis)   CKD (chronic kidney disease), stage III ([place])   Fall at home   Dementia without behavioral disturbance [facility])    Discharge Instructions  Discharge Instructions     Call MD for:  difficulty breathing, headache or visual disturbances   Complete by: As directed    Call MD for:  extreme fatigue   Complete by: As directed    Call MD for:  persistant dizziness or light-headedness   Complete by: As directed    Call MD for:  persistant nausea and vomiting   Complete by: As directed    Call MD for:  severe uncontrolled pain   Complete by: As directed    Call MD for:  temperature >100.4   Complete by: As directed    Diet - low sodium heart healthy   Complete by: As directed    Increase activity slowly   Complete by: As directed       Allergies as of 02/15/2022       Reactions   Benzocaine Other (See Comments)   Oral blisters   Lovenox [enoxaparin Sodium] Other (See Comments)   Has received this med without issue since it was listed as an allergy   Stadol [butorphanol] Other (See Comments)   Had a stroke after receiving   Tape Other (See Comments)   blisters   Vistaril [hydroxyzine Hcl] Other (See Comments)   Had a stroke after receiving   Warfarin And Related Other (See Comments)   Hematoma while on this and lovenox   Demerol [meperidine] Rash   Dilaudid [hydromorphone Hcl] Itching, Rash   Macrobid [nitrofurantoin] Rash        Medication List     STOP taking these medications    HYDROcodone-acetaminophen 5-325 MG tablet Commonly known as: NORCO/VICODIN       TAKE these medications    cetirizine 10 MG tablet Commonly known as: ZYRTEC Take 10 mg by mouth daily.   clonazePAM 1 MG tablet Commonly  known as: KLONOPIN Take 1.5 mg by mouth daily.   Eliquis 2.5 MG Tabs tablet Generic drug: apixaban Take 2.5 mg by mouth 2 (two) times daily.   guaiFENesin-dextromethorphan 100-10 MG/5ML syrup Commonly known as: ROBITUSSIN DM Take 10 mLs by mouth every 4 (four) hours as needed for cough.   ibuprofen 400 MG tablet Commonly known as: ADVIL Take 1 tablet (400 mg total) by mouth 4 (four) times daily for 5 days.   lidocaine 5 % Commonly known as: LIDODERM Place 1 patch onto the skin daily for 28 days. Remove & Discard patch within 12 hours or as directed by MD   methocarbamol 500 MG tablet Commonly known as: ROBAXIN Take 1 tablet (500 mg total) by mouth every 6 (six) hours as needed for muscle spasms.   metoprolol succinate 25 MG 24 hr tablet Commonly known as: TOPROL-XL Take 12.5 mg by mouth daily.   mirtazapine 45 MG tablet Commonly known as: REMERON Take 45 mg by mouth at bedtime.   oxyCODONE 5 MG immediate release tablet Commonly known as: Roxicodone Take 0.5 tablets (2.5 mg total) by mouth every 4 (four) hours as needed for severe pain.   pantoprazole 40 MG tablet Commonly known as: PROTONIX Take 40 mg by mouth  2 (two) times daily.   QUEtiapine 25 MG tablet Commonly known as: SEROQUEL Take 25 mg by mouth at bedtime.   rOPINIRole 2 MG tablet Commonly known as: REQUIP Take 7 mg by mouth at bedtime.   sertraline 100 MG tablet Commonly known as: ZOLOFT Take 100 mg by mouth daily.   trimethoprim 100 MG tablet Commonly known as: TRIMPEX Take 100 mg by mouth daily.   valACYclovir 500 MG tablet Commonly known as: VALTREX Take 500 mg by mouth daily.   VITAMIN B-12 IJ Inject 1,000 mcg as directed every 30 (thirty) days.        Follow-up Information     [name], MD. Schedule an appointment as soon as possible for a visit in 1 week(s).   Specialty: Internal Medicine Contact information: [place] [address] [phone]                 Allergies  Allergen Reactions   Benzocaine Other (See Comments)    Oral blisters   Lovenox [Enoxaparin Sodium] Other (See Comments)    Has received this med without issue since it was listed as an allergy   Stadol [Butorphanol] Other (See Comments)    Had a stroke after receiving   Tape Other (See Comments)    blisters   Vistaril [Hydroxyzine Hcl] Other (See Comments)    Had a stroke after receiving   Warfarin And Related Other (See Comments)    Hematoma while on this and lovenox   Demerol [Meperidine] Rash   Dilaudid [Hydromorphone Hcl] Itching and Rash   Macrobid [Nitrofurantoin] Rash    Consultations: Trauma surgery   Procedures/Studies: CT Chest Wo Contrast  Addendum Date: 02/14/2022   ADDENDUM REPORT: 02/14/2022 16:03 ADDENDUM: The left pleural effusion measures simple fluid density, and does not appears to be hemothorax. No pneumothorax. Electronically Signed   By: Keane Police D.O.   On: 02/14/2022 16:03   Result Date: 02/14/2022 CLINICAL DATA:  Blunt chest trauma.  Frequent falls. EXAM: CT CHEST WITHOUT CONTRAST TECHNIQUE: Multidetector CT imaging of the chest was performed following the standard protocol without IV contrast. RADIATION DOSE REDUCTION: This exam was performed according to the departmental dose-optimization program which includes automated exposure control, adjustment of the mA and/or kV according to patient size and/or use of iterative reconstruction technique. COMPARISON:  Chest radiograph performed earlier on the same date. FINDINGS: Cardiovascular: Normal heart size. No pericardial effusion. Mild coronary artery atherosclerotic calcifications. Aorta is normal in caliber with atherosclerotic calcification of the aortic arch. Mediastinum/Nodes: No enlarged mediastinal or axillary lymph nodes. Thyroid gland, trachea, and esophagus demonstrate no significant findings. Large hiatal hernia with intrathoracic stomach. Lungs/Pleura: Moderate size left  pleural effusion. Left basilar atelectasis secondary to large hiatal hernia. Mild bilateral lower lobe fibrosis/dependent atelectasis. No evidence of pneumonia or pulmonary edema. Upper Abdomen: Status post cholecystectomy. Partially imaged IVC filter in place. Large hiatal hernia with intrathoracic stomach. Musculoskeletal: Mildly displaced fractures of the left 5-8th ribs. Osteopenia and moderate thoracic spondylosis. Chronic compression deformity of L1 vertebral body. IMPRESSION: 1.  Mildly displaced fractures of the left 5-8th ribs. 2.  Moderate left pleural effusion with left basilar atelectasis. 3.  Large hiatal hernia with intrathoracic stomach. 4.  Chronic compression deformity of the L1 vertebral body. 5.  No acute cardiopulmonary process. Aortic Atherosclerosis (ICD10-I70.0). Electronically Signed: By: Keane Police D.O. On: 02/14/2022 14:51   DG Chest Port 1 View  Result Date: 02/14/2022 CLINICAL DATA:  Shortness of breath.  Frequent falls. EXAM: PORTABLE CHEST 1 VIEW COMPARISON:  06/16/2021 FINDINGS: Large hiatal hernia. Stable cardiomediastinal contours. New small left pleural effusion. No signs of interstitial edema or airspace consolidation. There is a age indeterminate fracture deformity involving the proximal left humerus. This appears new when compared with the previous exam. IMPRESSION: 1. New small left pleural effusion. 2. Age-indeterminate fracture deformity of the proximal left humerus. Electronically Signed   By: Kerby Moors M.D.   On: 02/14/2022 13:24     Subjective:   Discharge Exam: Vitals:   02/15/22 0702 02/15/22 0800  BP: (!) 150/71 134/68  Pulse: 79 87  Resp:  16  Temp: 97.8 F (36.6 C) 98.3 F (36.8 C)  SpO2: 100% 94%   Vitals:   02/14/22 2127 02/14/22 2236 02/15/22 0702 02/15/22 0800  BP: (!) 148/66 (!) 141/75 (!) 150/71 134/68  Pulse: 81 87 79 87  Resp: '18 19  16  '$ Temp: 98.4 F (36.9 C) 98.4 F (36.9 C) 97.8 F (36.6 C) 98.3 F (36.8 C)  TempSrc:  Oral Oral Oral Oral  SpO2: 100% 98% 100% 94%  Weight:      Height:        Physical Exam: GEN: NAD, alert, pleasantly confused HEENT: NCAT, PERRL, EOMI, sclera clear, MMM PULM: Breath sounds slightly decreased left base, no wheezing/crackles, normal respiratory effort, on room air CV: RRR w/o M/G/R GI: abd soft, NTND, NABS, no R/G/M MSK: no peripheral edema, muscle strength globally intact 5/5 bilateral upper/lower extremities NEURO: CN II-XII intact, no focal deficits, sensation to light touch intact PSYCH: normal mood/affect Integumentary: dry/intact, no rashes or wounds    The results of significant diagnostics from this hospitalization (including imaging, microbiology, ancillary and laboratory) are listed below for reference.     Microbiology: Recent Results (from the past 240 hour(s))  SARS Coronavirus 2 by RT PCR (hospital order, performed in [facility] hospital lab) *cepheid single result test* Anterior Nasal Swab     Status: None   Collection Time: 02/14/22  3:41 PM   Specimen: Anterior Nasal Swab  Result Value Ref Range Status   SARS Coronavirus 2 by RT PCR NEGATIVE NEGATIVE Final    Comment: (NOTE) SARS-CoV-2 target nucleic acids are NOT DETECTED.  The SARS-CoV-2 RNA is generally detectable in upper and lower respiratory specimens during the acute phase of infection. The lowest concentration of SARS-CoV-2 viral copies this assay can detect is 250 copies / mL. A negative result does not preclude SARS-CoV-2 infection and should not be used as the sole basis for treatment or other patient management decisions.  A negative result may occur with improper specimen collection / handling, submission of specimen other than nasopharyngeal swab, presence of viral mutation(s) within the areas targeted by this assay, and inadequate number of viral copies (<250 copies / mL). A negative result must be combined with clinical observations, patient history, and epidemiological  information.  Fact Sheet for Patients:   https://www.patel.info/  Fact Sheet for Healthcare Providers: https://hall.com/  This test is not yet approved or  cleared by the [place] FDA and has been authorized for detection and/or diagnosis of SARS-CoV-2 by FDA under an Emergency Use Authorization (EUA).  This EUA will remain in effect (meaning this test can be used) for the duration of the COVID-19 declaration under Section 564(b)(1) of the Act, 21 U.S.C. section 360bbb-3(b)(1), unless the authorization is terminated or revoked sooner.  Performed at [facility], [address], [place], [place] [address]  Labs: BNP (last 3 results) No results for input(s): BNP in the last 8760 hours. Basic Metabolic Panel: Recent Labs  Lab 02/14/22 1327 02/14/22 2041 02/15/22 0731  NA 139 142 141  K 4.3 4.2 4.6  CL 111 112* 113*  CO2 21* 22 20*  GLUCOSE 87 92 100*  BUN 40* 36* 37*  CREATININE 2.16* 2.18* 2.13*  CALCIUM 8.6* 8.8* 8.7*  MG  --  2.2  --   PHOS  --  4.2  --    Liver Function Tests: Recent Labs  Lab 02/14/22 1327 02/15/22 0731  AST 12* 21  ALT 9 13  ALKPHOS 63 60  BILITOT 0.3 0.6  PROT 6.9 6.1*  ALBUMIN 3.2* 3.0*   No results for input(s): LIPASE, AMYLASE in the last 168 hours. No results for input(s): AMMONIA in the last 168 hours. CBC: Recent Labs  Lab 02/14/22 1327 02/14/22 2041 02/15/22 0115 02/15/22 0731  WBC 4.0 3.9* 3.8* 4.4  NEUTROABS 2.0  --   --   --   HGB 8.2* 8.1* 8.0* 7.9*  HCT 25.7* 26.1* 25.5* 26.0*  MCV 97.7 99.2 99.2 100.4*  PLT 253 259 257 241   Cardiac Enzymes: Recent Labs  Lab 02/14/22 2041  CKTOTAL 41   BNP: Invalid input(s): POCBNP CBG: No results for input(s): GLUCAP in the last 168 hours. D-Dimer No results for input(s): DDIMER in the last 72 hours. Hgb A1c No results for input(s): HGBA1C in the last 72 hours. Lipid Profile No results for input(s): CHOL,  HDL, LDLCALC, TRIG, CHOLHDL, LDLDIRECT in the last 72 hours. Thyroid function studies No results for input(s): TSH, T4TOTAL, T3FREE, THYROIDAB in the last 72 hours.  Invalid input(s): FREET3 Anemia work up Recent Labs    02/14/22 2058  VITAMINB12 780  FOLATE 12.8  FERRITIN 54  TIBC 297  IRON 27*  RETICCTPCT 1.3   Urinalysis    Component Value Date/Time   COLORURINE STRAW (A) 02/14/2022 2306   APPEARANCEUR CLEAR 02/14/2022 2306   LABSPEC 1.012 02/14/2022 2306   PHURINE 5.0 02/14/2022 2306   GLUCOSEU NEGATIVE 02/14/2022 2306   HGBUR NEGATIVE 02/14/2022 2306   BILIRUBINUR NEGATIVE 02/14/2022 2306   KETONESUR NEGATIVE 02/14/2022 2306   PROTEINUR NEGATIVE 02/14/2022 2306   UROBILINOGEN 0.2 07/31/2014 1130   NITRITE NEGATIVE 02/14/2022 2306   LEUKOCYTESUR NEGATIVE 02/14/2022 2306   Sepsis Labs Invalid input(s): PROCALCITONIN,  WBC,  LACTICIDVEN Microbiology Recent Results (from the past 240 hour(s))  SARS Coronavirus 2 by RT PCR (hospital order, performed in [facility] hospital lab) *cepheid single result test* Anterior Nasal Swab     Status: None   Collection Time: 02/14/22  3:41 PM   Specimen: Anterior Nasal Swab  Result Value Ref Range Status   SARS Coronavirus 2 by RT PCR NEGATIVE NEGATIVE Final    Comment: (NOTE) SARS-CoV-2 target nucleic acids are NOT DETECTED.  The SARS-CoV-2 RNA is generally detectable in upper and lower respiratory specimens during the acute phase of infection. The lowest concentration of SARS-CoV-2 viral copies this assay can detect is 250 copies / mL. A negative result does not preclude SARS-CoV-2 infection and should not be used as the sole basis for treatment or other patient management decisions.  A negative result may occur with improper specimen collection / handling, submission of specimen other than nasopharyngeal swab, presence of viral mutation(s) within the areas targeted by this assay, and inadequate number of viral copies (<250  copies / mL). A negative result must be combined with clinical  observations, patient history, and epidemiological information.  Fact Sheet for Patients:   https://www.patel.info/  Fact Sheet for Healthcare Providers: https://hall.com/  This test is not yet approved or  cleared by the [place] FDA and has been authorized for detection and/or diagnosis of SARS-CoV-2 by FDA under an Emergency Use Authorization (EUA).  This EUA will remain in effect (meaning this test can be used) for the duration of the COVID-19 declaration under Section 564(b)(1) of the Act, 21 U.S.C. section 360bbb-3(b)(1), unless the authorization is terminated or revoked sooner.  Performed at [facility], [address] [place], [place] [address]      Time coordinating discharge: Over 30 minutes  SIGNED:   Nadiyah Zeis J [place] DO  [facility] 02/15/2022, 1:43 PM

## 2022-02-15 NOTE — TOC Transition Note (Signed)
Transition of Care [facility]) - CM/SW Discharge Note   Patient Details  Name: Brittney Hicks MRN: [id number] Date of Birth: [birth year]  Transition of Care [facility]) CM/SW Contact:  Bartholomew Crews, RN Phone Number: [phone] 02/15/2022, 2:30 PM   Clinical Narrative:     Spoke with patient's daughter, Davy Pique, on her mobile phone. Demographics confirmed. Patient lives with Davy Pique. One of Sonya's sisters will provide transportation home. Offered choice of [place] agency. Referral sent to [facility]. Orders and notes faxed to [phone]. Spoke to Lassalle Comunidad who will confirm acceptance tomorrow. No DME needed. No further TOC needs identified at this time.   Final next level of care: [place] Barriers to Discharge: No Barriers Identified   Patient Goals and CMS Choice Patient states their goals for this hospitalization and ongoing recovery are:: home with daughter, Davy Pique CMS Medicare.gov Compare Post Acute Care list provided to:: Patient Represenative (must comment) Choice offered to / list presented to : Adult Children  Discharge Placement                       Discharge Plan and Services                DME Arranged: N/A DME Agency: NA       HH Arranged: RN, PT, OT, Nurse's Aide [place] Agency: [facility] Date [place]: 02/15/22 Time [place]: 1430 Representative spoke with at [place]: [place] Determinants of Health ([place]) Interventions     Readmission Risk Interventions     View : No data to display.

## 2022-02-15 NOTE — Evaluation (Addendum)
Physical Therapy Evaluation Patient Details Name: Brittney Hicks MRN: [id number] DOB: 1929-06-22 Today's Date: 02/15/2022  History of Present Illness  Pt is a 88 y.o. female who presented 02/14/22 with chest pain after having fallen x11 days earlier. Pt found to have mildly displaced fxs of L ribs 5-8. Radiographs also revealed age-indeterminate fracture deformity of the proximal left humerus and chronic compression deformity of the L1 vertebral body. PMH: chronic back pain, fibromyalgia, OA, PE, skin cancer, CVA, CKD, dementia   Clinical Impression  Pt presents with condition above and deficits mentioned below, see PT Problem List. PTA, she was requiring assistance for bathing, dressing, and medication/financial management and supervision for gait with her rollator. Pt lives with her daughter in a 1-level house with a flat entry and has an aide come 1x/week to assist her. Someone is with her 24/7. Currently, pt is requiring min guard assist for safety using a walker for stability. She displays deficits in balance, gross strength, and activity tolerance. She is at risk for subsequent falls, suggested by her very slow gait speed (needed ~3-5 seconds to advance 1 foot). As pt has the level of care needed at home and she is at risk for subsequent falls, recommending HHPT follow-up. Will continue to follow acutely.     Recommendations for follow up therapy are one component of a multi-disciplinary discharge planning process, led by the attending physician.  Recommendations may be updated based on patient status, additional functional criteria and insurance authorization.  Follow Up Recommendations Home health PT    Assistance Recommended at Discharge Frequent or constant Supervision/Assistance  Patient can return home with the following  A little help with walking and/or transfers;Assist for transportation;Direct supervision/assist for medications management;Direct supervision/assist for financial  management;Assistance with cooking/housework;A little help with bathing/dressing/bathroom    Equipment Recommendations None recommended by PT  Recommendations for Other Services       Functional Status Assessment Patient has had a recent decline in their functional status and demonstrates the ability to make significant improvements in function in a reasonable and predictable amount of time.     Precautions / Restrictions Precautions Precautions: Fall Restrictions Weight Bearing Restrictions: No Other Position/Activity Restrictions:  (no restrictions per MD)      Mobility  Bed Mobility               General bed mobility comments: Pt standing with RN upon arrival    Transfers Overall transfer level: Needs assistance   Transfers: Sit to/from Stand Sit to Stand: Min guard           General transfer comment: Needs extra time and cues to pull on arm rests to scoot to edge of chair, min guard for safety    Ambulation/Gait Ambulation/Gait assistance: Min guard Gait Distance (Feet): 40 Feet Assistive device: Rolling walker (2 wheels) Gait Pattern/deviations: Step-through pattern, Decreased stride length, Trunk flexed Gait velocity: reduced Gait velocity interpretation: <1.31 ft/sec, indicative of household ambulator (took ~3-5 sec to advance 1 foot)   General Gait Details: Pt with very slow with small steps, maintaining a flexed posture, needing repeated cues to improve upright posture and for turning. No LOB, min guard for safety  Stairs            Wheelchair Mobility    Modified Rankin (Stroke Patients Only)       Balance Overall balance assessment: Needs assistance Sitting-balance support: No upper extremity supported, Feet supported Sitting balance-Leahy Scale: Fair     Standing balance support: Bilateral  upper extremity supported, During functional activity, Reliant on assistive device for balance Standing balance-Leahy Scale: Poor Standing  balance comment: Reliant on RW                             Pertinent Vitals/Pain Pain Assessment Pain Assessment: Faces Faces Pain Scale: Hurts little more Pain Location: L ribs Pain Descriptors / Indicators: Discomfort, Guarding Pain Intervention(s): Limited activity within patient's tolerance, Monitored during session, Repositioned    Home Living Family/patient expects to be discharged to:: Private residence Living Arrangements: Children Available Help at Discharge: Family;Available 24 hours/day;Personal care attendant (aide comes 1x/week) Type of Home: House Home Access: Level entry       Home Layout: One level Home Equipment: Rollator (4 wheels);Other (comment);Tub bench;BSC/3in1;Grab bars - tub/shower (lift chair) Additional Comments: sleeps in lift chair; has chair alarm in her seat for at night and pt also has a call bell    Prior Function Prior Level of Function : Needs assist;History of Falls (last six months)       Physical Assist : ADLs (physical);Mobility (physical) Mobility (physical): Gait ADLs (physical): Bathing;Dressing;IADLs Mobility Comments: Pt needs supervision for gait with rollator for safety. Holds onto sink in bathroom as rollator cannot fit in bathroom. x3 falls in past 6 months. ADLs Comments: Family began assisting pt with dressing x1 week prior due to pain and for time purposes as pt is very slow to dress self. Showers 1x/week with aide, otherwise does bird baths with assistance from family. Family manages meds and finances. Is able to groom herself without assistance.     Hand Dominance        Extremity/Trunk Assessment   Upper Extremity Assessment Upper Extremity Assessment: Defer to OT evaluation    Lower Extremity Assessment Lower Extremity Assessment: Generalized weakness    Cervical / Trunk Assessment Cervical / Trunk Assessment: Kyphotic  Communication   Communication: No difficulties  Cognition Arousal/Alertness:  Awake/alert Behavior During Therapy: Flat affect Overall Cognitive Status: History of cognitive impairments - at baseline                                 General Comments: Dementia at baseline. Needs cues to direct her with mobility and sequening on how to perform some tasks, likely baseline.        General Comments General comments (skin integrity, edema, etc.): SpO2 >/= 93% on RA throughout; educated pt and daughter on use of incentive spirometer    Exercises Other Exercises Other Exercises: incentive spirometer >8x   Assessment/Plan    PT Assessment Patient needs continued PT services  PT Problem List Decreased strength;Decreased activity tolerance;Decreased balance;Decreased mobility;Decreased cognition;Pain       PT Treatment Interventions DME instruction;Gait training;Stair training;Therapeutic exercise;Functional mobility training;Therapeutic activities;Balance training;Neuromuscular re-education;Cognitive remediation;Patient/family education    PT Goals (Current goals can be found in the Care Plan section)  Acute Rehab PT Goals Patient Stated Goal: agreeable to PT PT Goal Formulation: With patient/family Time For Goal Achievement: 03/01/22 Potential to Achieve Goals: Good    Frequency Min 3X/week     Co-evaluation               AM-PAC PT "6 Clicks" Mobility  Outcome Measure Help needed turning from your back to your side while in a flat bed without using bedrails?: A Little Help needed moving from lying on your back to sitting on the  side of a flat bed without using bedrails?: A Little Help needed moving to and from a bed to a chair (including a wheelchair)?: A Little Help needed standing up from a chair using your arms (e.g., wheelchair or bedside chair)?: A Little Help needed to walk in hospital room?: A Little Help needed climbing 3-5 steps with a railing? : A Lot 6 Click Score: 17    End of Session   Activity Tolerance: Patient  tolerated treatment well Patient left: in chair;with call bell/phone within reach;with family/visitor present Nurse Communication: Mobility status;Other (comment) (sats) PT Visit Diagnosis: Unsteadiness on feet (R26.81);Other abnormalities of gait and mobility (R26.89);Muscle weakness (generalized) (M62.81);History of falling (Z91.81);Repeated falls (R29.6);Difficulty in walking, not elsewhere classified (R26.2);Pain Pain - Right/Left: Left Pain - part of body:  (ribs)    Time: 9528-4132 PT Time Calculation (min) (ACUTE ONLY): 32 min   Charges:   PT Evaluation $PT Eval Moderate Complexity: 1 Mod PT Treatments $Therapeutic Activity: 8-22 mins        Moishe Spice, PT, DPT Acute Rehabilitation Services  Pager: [phone] Office: [phone]   Orvan Falconer 02/15/2022, 9:56 AM

## 2022-02-15 NOTE — Evaluation (Signed)
Clinical/Bedside Swallow Evaluation Patient Details  Name: Brittney Hicks MRN: [id number] Date of Birth: 01-08-1929  Today's Date: 02/15/2022 Time: SLP Start Time (ACUTE ONLY): 1444 SLP Stop Time (ACUTE ONLY): 1454 SLP Time Calculation (min) (ACUTE ONLY): 10 min  Past Medical History:  Past Medical History:  Diagnosis Date   Chronic back pain greater than 3 months duration    Chronic pain    Fibromyalgia    Osteoarthritis    PE (pulmonary embolism)    Skin cancer    Stroke [facility])    Past Surgical History:  Past Surgical History:  Procedure Laterality Date   ABDOMINAL HYSTERECTOMY     CATARACT EXTRACTION, BILATERAL     CHOLECYSTECTOMY     FOOT SURGERY     HAND SURGERY     INSERTION OF MESH     WRIST FRACTURE SURGERY     HPI:  Pt is a 88 y.o. female who presented 02/14/22 with chest pain after having fallen x11 days earlier. Pt found to have mildly displaced fxs of L ribs 5-8. Radiographs also revealed age-indeterminate fracture deformity of the proximal left humerus and chronic compression deformity of the L1 vertebral body. PMH: chronic back pain, fibromyalgia, OA, PE, skin cancer, CVA, GERD, CKD, dementia    Assessment / Plan / Recommendation  Clinical Impression  Pt presented with normal oropharyngeal swallow function.  Oral mechanism exam was unremarkable; dentures present. Demonstrated thorough mastication of solids and brisk consumption of thin liquids with no s/s of aspiration.  No evidence of dysphagia. Continue regular solids/thin liquids; meds with water. No SLP f/u needed. Pt preparing for D/C home.  SLP Visit Diagnosis: Dysphagia, unspecified (R13.10)    Aspiration Risk  No limitations    Diet Recommendation   Regular solids, thin liquids  Medication Administration: Whole meds with liquid    Other  Recommendations Oral Care Recommendations: Oral care BID    Recommendations for follow up therapy are one component of a multi-disciplinary discharge planning  process, led by the attending physician.  Recommendations may be updated based on patient status, additional functional criteria and insurance authorization.  Follow up Recommendations No SLP follow up        Swallow Study   General HPI: Pt is a 88 y.o. female who presented 02/14/22 with chest pain after having fallen x11 days earlier. Pt found to have mildly displaced fxs of L ribs 5-8. Radiographs also revealed age-indeterminate fracture deformity of the proximal left humerus and chronic compression deformity of the L1 vertebral body. PMH: chronic back pain, fibromyalgia, OA, PE, skin cancer, CVA, GERD, CKD, dementia Type of Study: Bedside Swallow Evaluation Previous Swallow Assessment: no Diet Prior to this Study: Regular;Thin liquids Temperature Spikes Noted: No Respiratory Status: Room air History of Recent Intubation: No Behavior/Cognition: Alert;Cooperative Oral Cavity Assessment: Within Functional Limits Oral Care Completed by SLP: No Oral Cavity - Dentition: Dentures, top;Dentures, bottom Vision: Functional for self-feeding Self-Feeding Abilities: Able to feed self Patient Positioning: Upright in bed Baseline Vocal Quality: Normal Volitional Cough: Strong Volitional Swallow: Able to elicit    Oral/Motor/Sensory Function Overall Oral Motor/Sensory Function: Within functional limits   Ice Chips Ice chips: Not tested   Thin Liquid Thin Liquid: Within functional limits    Nectar Thick Nectar Thick Liquid: Not tested   Honey Thick Honey Thick Liquid: Not tested   Puree Puree: Not tested   Solid     Solid: Within functional limits      Juan Quam Laurice 02/15/2022,2:58 PM Creedon Danielski L.  Tivis Ringer, [place] Office number [phone] Pager [phone]

## 2022-02-15 NOTE — Progress Notes (Signed)
SATURATION QUALIFICATIONS: (This note is used to comply with regulatory documentation for home oxygen)  Patient Saturations on Room Air at Rest = 96%  Patient Saturations on Room Air while Ambulating = 93%   Please briefly explain why patient needs home oxygen: Pt does not need supplemental O2.   Moishe Spice, PT, DPT Acute Rehabilitation Services  Pager: [phone] Office: [phone]

## 2022-02-15 NOTE — Progress Notes (Signed)
Patient ID: Brittney Hicks, female   DOB: 05-15-1929, 88 y.o.   MRN: [id number] Doing well on tylenol and robaxin, PT to see today pending dispo.

## 2022-02-15 NOTE — Progress Notes (Signed)
Patient discharged to home, IV removed, AVS reviewed and all questions were answered,

## 2022-07-22 DEATH — deceased

## 2023-08-20 IMAGING — CT CT CHEST W/O CM
2 of 3 series · 15 of 36 positions shown, 18 images · non-contrast
Comparison: Chest radiograph performed earlier on the same date.
COMPARISON: Chest radiograph performed earlier on the same date.

Addendum:
CLINICAL DATA: Blunt chest trauma.  Frequent falls.



[Series 2: thorax · axial · 0.64mm/px · z∈[-526,-262]mm · 12 of 156 slices shown, 15 images]
[im 12/156  mediastinal]
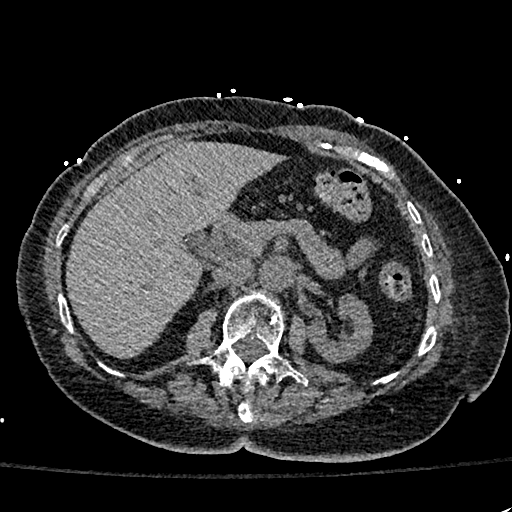
[im 12/156  lung]
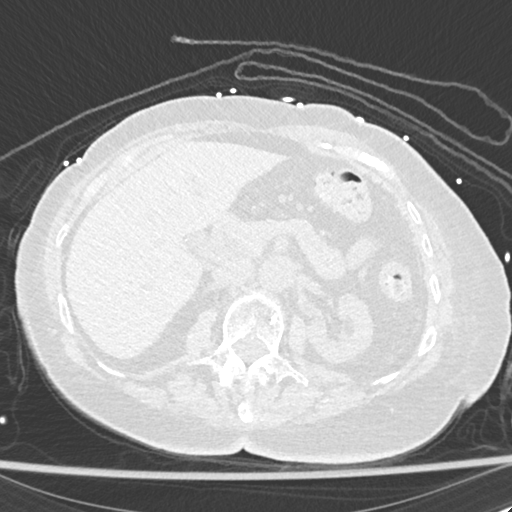
[im 23/156  lung]
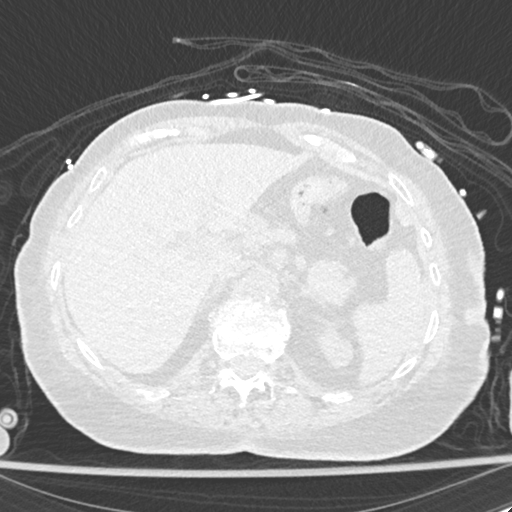
[im 35/156  lung]
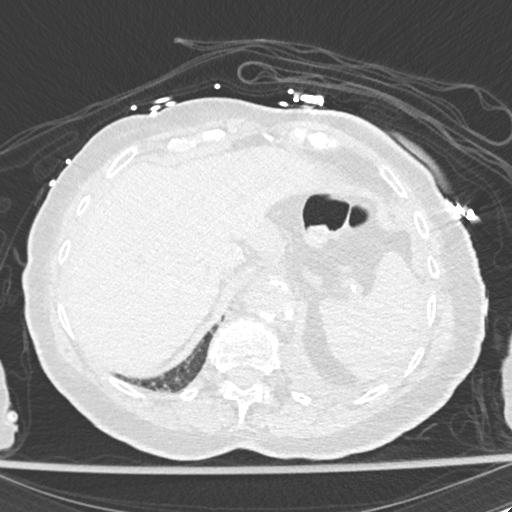
[im 46/156  lung]
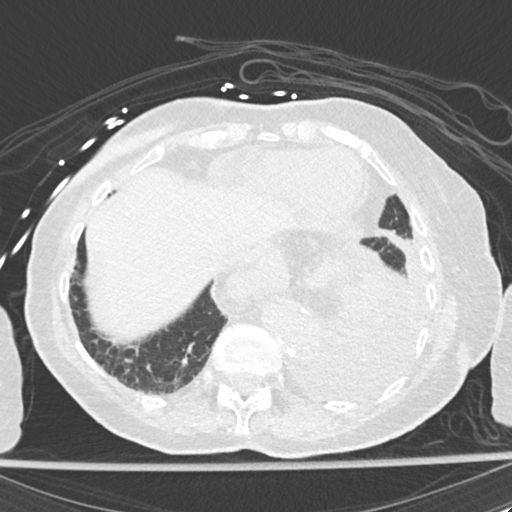
[im 58/156  mediastinal]
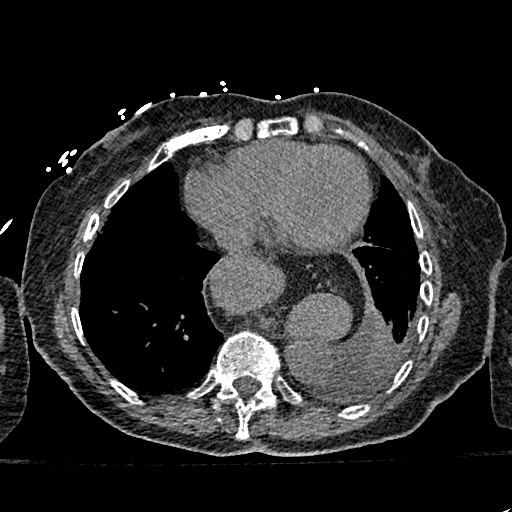
[im 58/156  lung]
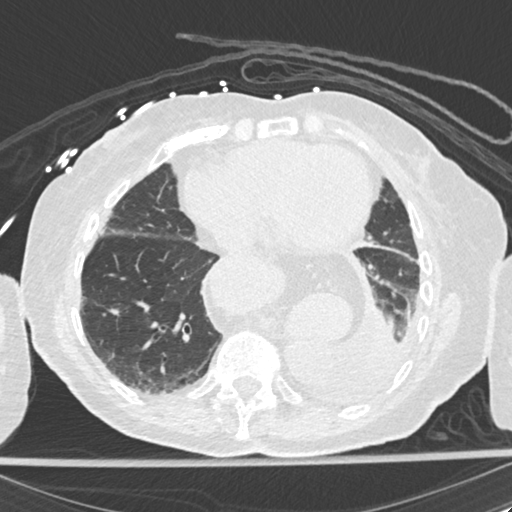
[im 69/156  lung]
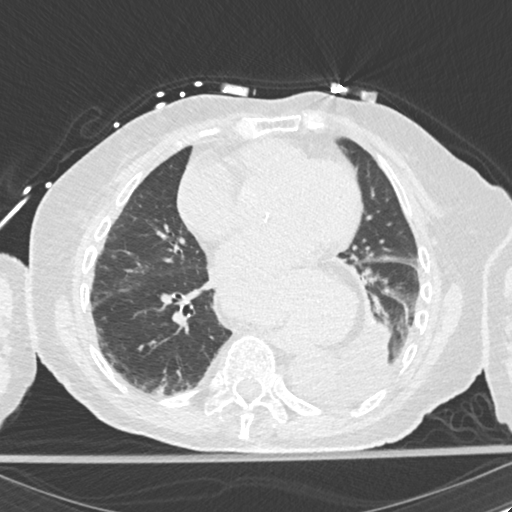
[im 87/156  lung]
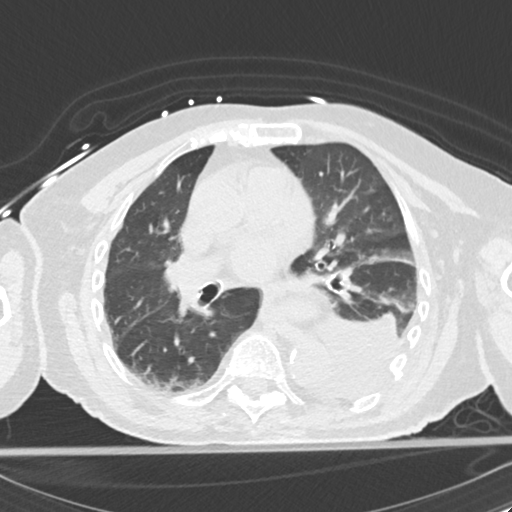
[im 98/156  lung]
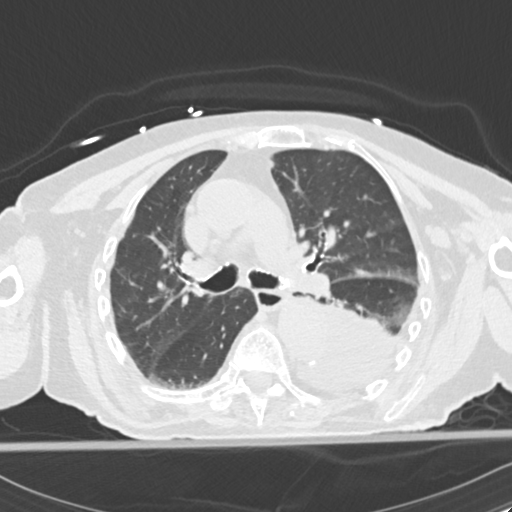
[im 110/156  mediastinal]
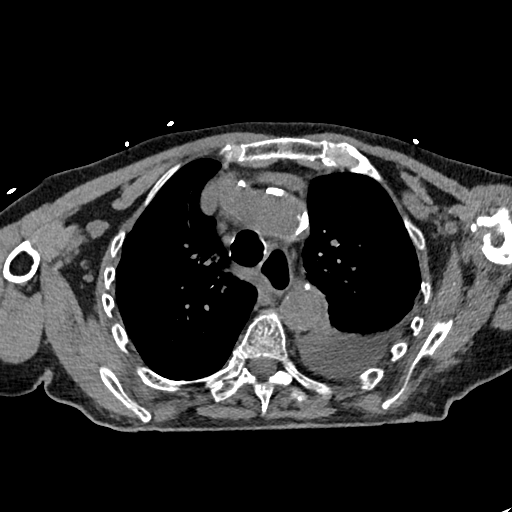
[im 110/156  lung]
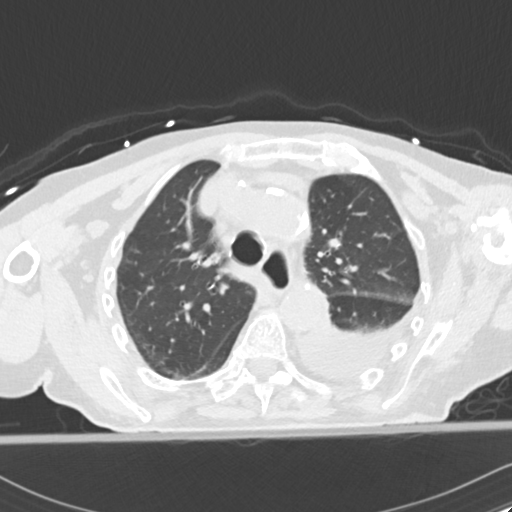
[im 121/156  lung]
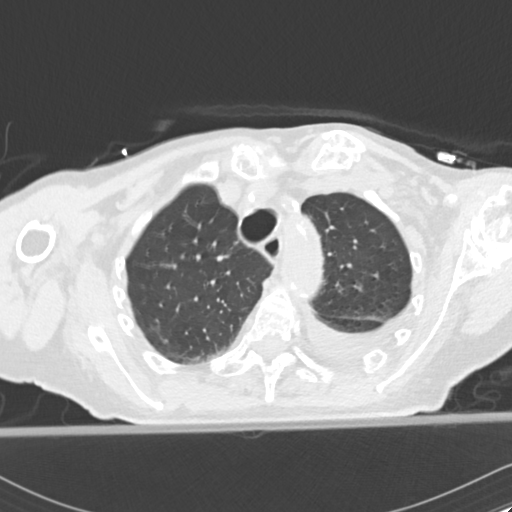
[im 133/156  lung]
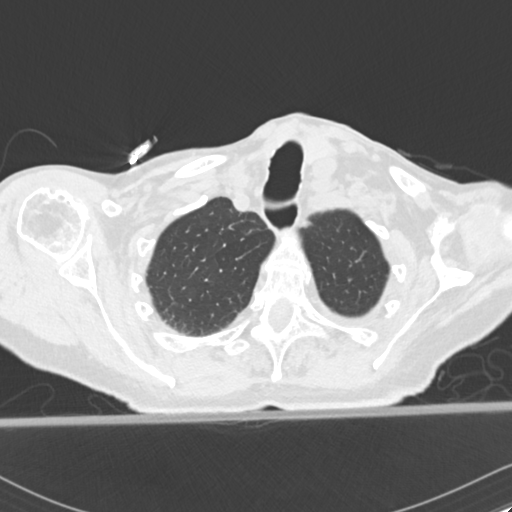
[im 144/156  lung]
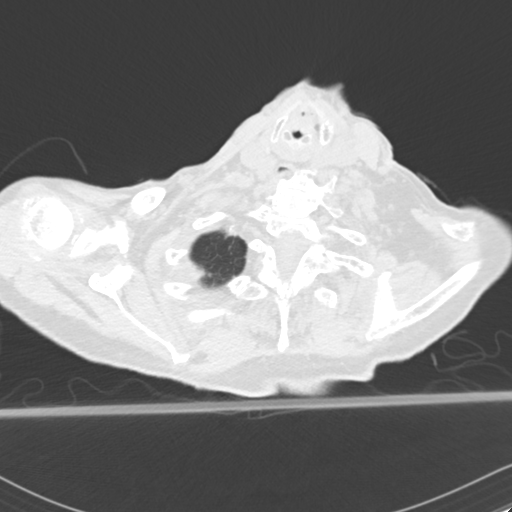

[Series 5: coronal · coronal · 0.63mm/px · 3 of 117 slices shown]
[im 24/117  lung]
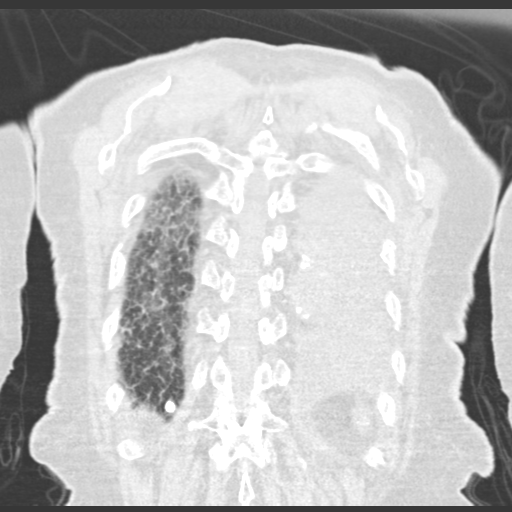
[im 47/117  lung]
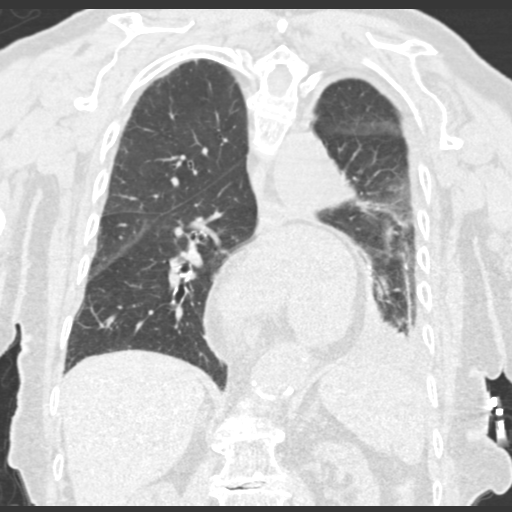
[im 70/117  lung]
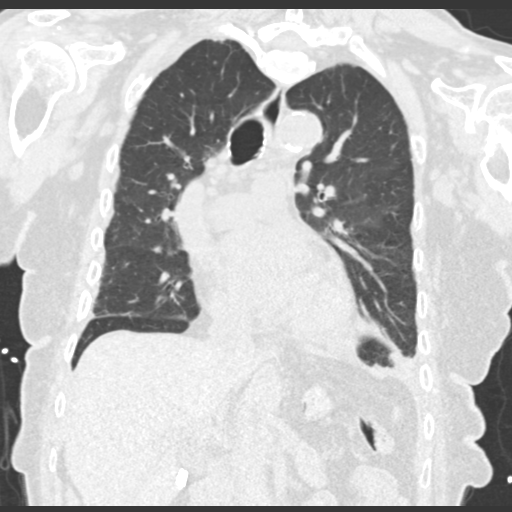

[15 of 36 positions shown; findings below may reference images not displayed]

FINDINGS: Cardiovascular: Normal heart size. No pericardial effusion. Mild
coronary artery atherosclerotic calcifications. Aorta is normal in
caliber with atherosclerotic calcification of the aortic arch.

Mediastinum/Nodes: No enlarged mediastinal or axillary lymph nodes.
Thyroid gland, trachea, and esophagus demonstrate no significant
findings. Large hiatal hernia with intrathoracic stomach.

Lungs/Pleura: Moderate size left pleural effusion. Left basilar
atelectasis secondary to large hiatal hernia. Mild bilateral lower
lobe fibrosis/dependent atelectasis. No evidence of pneumonia or
pulmonary edema.

Upper Abdomen: Status post cholecystectomy. Partially imaged IVC
filter in place. Large hiatal hernia with intrathoracic stomach.

Musculoskeletal: Mildly displaced fractures of the left 5-8th ribs.
Osteopenia and moderate thoracic spondylosis. Chronic compression
deformity of L1 vertebral body.
IMPRESSION: 1.  Mildly displaced fractures of the left 5-8th ribs.

2.  Moderate left pleural effusion with left basilar atelectasis.

3.  Large hiatal hernia with intrathoracic stomach.

4.  Chronic compression deformity of the L1 vertebral body.

5.  No acute cardiopulmonary process.

Aortic Atherosclerosis (SGR7M-QOH.H).

ADDENDUM:
The left pleural effusion measures simple fluid density, and does
not appears to be hemothorax. No pneumothorax.

*** End of Addendum ***
FINDINGS: Cardiovascular: Normal heart size. No pericardial effusion. Mild
coronary artery atherosclerotic calcifications. Aorta is normal in
caliber with atherosclerotic calcification of the aortic arch.

Mediastinum/Nodes: No enlarged mediastinal or axillary lymph nodes.
Thyroid gland, trachea, and esophagus demonstrate no significant
findings. Large hiatal hernia with intrathoracic stomach.

Lungs/Pleura: Moderate size left pleural effusion. Left basilar
atelectasis secondary to large hiatal hernia. Mild bilateral lower
lobe fibrosis/dependent atelectasis. No evidence of pneumonia or
pulmonary edema.

Upper Abdomen: Status post cholecystectomy. Partially imaged IVC
filter in place. Large hiatal hernia with intrathoracic stomach.

Musculoskeletal: Mildly displaced fractures of the left 5-8th ribs.
Osteopenia and moderate thoracic spondylosis. Chronic compression
deformity of L1 vertebral body.
IMPRESSION: 1.  Mildly displaced fractures of the left 5-8th ribs.

2.  Moderate left pleural effusion with left basilar atelectasis.

3.  Large hiatal hernia with intrathoracic stomach.

4.  Chronic compression deformity of the L1 vertebral body.

5.  No acute cardiopulmonary process.

Aortic Atherosclerosis (SGR7M-QOH.H).
